# Patient Record
Sex: Female | Born: 1990 | Race: Black or African American | Hispanic: No | Marital: Single | Smoking: Never smoker
Health system: Southern US, Community
[De-identification: ages and names within clinical notes are randomized; demographics above are authoritative.]

## PROBLEM LIST (undated history)

## (undated) DIAGNOSIS — K2970 Gastritis, unspecified, without bleeding: Secondary | ICD-10-CM

## (undated) DIAGNOSIS — L729 Follicular cyst of the skin and subcutaneous tissue, unspecified: Secondary | ICD-10-CM

## (undated) DIAGNOSIS — R55 Syncope and collapse: Secondary | ICD-10-CM

## (undated) DIAGNOSIS — F458 Other somatoform disorders: Secondary | ICD-10-CM

## (undated) HISTORY — DX: Follicular cyst of the skin and subcutaneous tissue, unspecified: L72.9

---
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
## 2012-11-07 ENCOUNTER — Other Ambulatory Visit (HOSPITAL_COMMUNITY): Payer: Self-pay | Admitting: Unknown Physician Specialty

## 2012-11-07 DIAGNOSIS — J338 Other polyp of sinus: Secondary | ICD-10-CM

## 2012-11-11 ENCOUNTER — Ambulatory Visit (HOSPITAL_COMMUNITY): Payer: Medicaid Other

## 2013-02-13 ENCOUNTER — Ambulatory Visit: Payer: No Typology Code available for payment source | Admitting: Neurology

## 2017-06-23 ENCOUNTER — Ambulatory Visit (HOSPITAL_COMMUNITY)
Admission: EM | Admit: 2017-06-23 | Discharge: 2017-06-23 | Disposition: A | Discharge status: Home / Self Care | Payer: Self-pay | Attending: Family Medicine | Admitting: Family Medicine

## 2017-06-23 ENCOUNTER — Encounter (HOSPITAL_COMMUNITY): Payer: Self-pay | Admitting: Emergency Medicine

## 2017-06-23 DIAGNOSIS — J0100 Acute maxillary sinusitis, unspecified: Secondary | ICD-10-CM

## 2017-06-23 MED ORDER — AZITHROMYCIN 250 MG PO TABS: 250.0000 mg | ORAL_TABLET | Freq: Every day | ORAL | 0 refills | Status: DC

## 2017-06-23 MED ORDER — PREDNISONE 10 MG (21) PO TBPK: ORAL_TABLET | ORAL | 0 refills | Status: DC

## 2017-06-23 NOTE — ED Triage Notes (Signed)
PT reports ongoing respiratory complaints for 2 months. PT reports cold, congestion, cough, fatigue for 2 months.

## 2017-06-23 NOTE — ED Provider Notes (Signed)
[place]    CSN: [id number] Arrival date & time: 06/23/17  1214     History   Chief Complaint Chief Complaint  Patient presents with  . URI    HPI Carrie [name] is a 27 y.o. female.   Healthy 27 y.o. Female, been having on and off URI symptoms since November. Reports that this is the third episode. Reports terrible facial pain and hurts to touch. Other symptoms are coughing, congestion, sneezing, coughing, and some wheezing. No SOB. No CP. No Abdominal pain. No headache. No fever.       Past Medical History:  Diagnosis Date  . Gastritis   . Hyperventilation syndrome   . Scalp cyst 1996  . Syncope     There are no active problems to display for this patient.   Past Surgical History:  Procedure Laterality Date  . BREAST SURGERY  2012   breast mass excision    OB History    No data available       Home Medications    Prior to Admission medications   Medication Sig Start Date End Date Taking? Authorizing Provider  Ascorbic Acid (VITAMIN C) 100 MG tablet Take 100 mg by mouth daily.   Yes [provider]    Family History No family history on file.  Social History Social History   Tobacco Use  . Smoking status: Never Smoker  . Smokeless tobacco: Never Used  Substance Use Topics  . Alcohol use: No  . Drug use: No     Allergies   Penicillins   Review of Systems Review of Systems  Constitutional: Negative for chills, fatigue and fever.  HENT: Positive for congestion, rhinorrhea, sinus pressure, sinus pain, sneezing and sore throat.   Respiratory: Positive for cough and wheezing. Negative for shortness of breath.   Cardiovascular: Negative for chest pain and palpitations.  Gastrointestinal: Negative for abdominal pain.  Skin: Negative for rash.  Neurological: Negative for dizziness and headaches.     Physical Exam Triage Vital Signs ED Triage Vitals  Enc Vitals Group     BP 06/23/17 1237 132/63     Pulse Rate  06/23/17 1237 88     Resp 06/23/17 1237 16     Temp 06/23/17 1237 98.6 F (37 C)     Temp Source 06/23/17 1237 Temporal     SpO2 06/23/17 1237 100 %     Weight 06/23/17 1238 98 lb (44.5 kg)     Height 06/23/17 1238 5' (1.524 m)     Head Circumference --      Peak Flow --      Pain Score 06/23/17 1237 3     Pain Loc --      Pain Edu? --      Excl. in [place]? --    No data found.  Updated Vital Signs BP 132/63   Pulse 88   Temp 98.6 F (37 C) (Temporal)   Resp 16   Ht 5' (1.524 m)   Wt 98 lb (44.5 kg)   LMP 06/22/2017   SpO2 100%   BMI 19.14 kg/m   Physical Exam  Constitutional: She is oriented to person, place, and time. She appears well-developed and well-nourished.  HENT:  Head: Normocephalic and atraumatic.  Right Ear: External ear normal.  Left Ear: External ear normal.  Nose: Nose normal.  Mouth/Throat: Oropharynx is clear and moist. No oropharyngeal exudate.  TM pearly gray bilaterally with no erythema. Maxillary sinuses are tender to percuss.  Eyes: Conjunctivae and EOM are normal. Pupils are equal, round, and reactive to light.  Neck: Normal range of motion. Neck supple.  Cardiovascular: Normal rate, regular rhythm and normal heart sounds.  Pulmonary/Chest: Effort normal and breath sounds normal. No respiratory distress. She has no wheezes. She exhibits no tenderness.  Abdominal: Soft. Bowel sounds are normal. There is no tenderness.  Lymphadenopathy:    She has cervical adenopathy.  Neurological: She is alert and oriented to person, place, and time.  Skin: Skin is warm and dry. No rash noted.  Nursing note and vitals reviewed.    UC Treatments / Results  Labs (all labs ordered are listed, but only abnormal results are displayed) Labs Reviewed - No data to display  EKG  EKG Interpretation None       Radiology No results found.  Procedures Procedures (including critical care time)  Medications Ordered in UC Medications - No data to  display   Initial Impression / Assessment and Plan / UC Course  I have reviewed the triage vital signs and the nursing notes.  Pertinent labs & imaging results that were available during my care of the patient were reviewed by me and considered in my medical decision making (see chart for details).  Final Clinical Impressions(s) / UC Diagnoses   Final diagnoses:  Acute non-recurrent maxillary sinusitis   Clinical presentation are most consistent with sinusitis. We'll send home with Z-Pak and prednisone. Patient states understanding and will call with questions or problems. Patient instructed to call or follow up with his/her primary care doctor if failure to improve or change in symptoms. Discharge instruction given.   ED Discharge Orders    None     Controlled Substance Prescriptions [place] Controlled Substance Registry consulted? Not Applicable   Barry Dienes, NP 06/23/17 1259

## 2020-05-24 ENCOUNTER — Other Ambulatory Visit: Payer: Self-pay

## 2020-05-24 ENCOUNTER — Ambulatory Visit
Admission: EM | Admit: 2020-05-24 | Discharge: 2020-05-24 | Disposition: A | Discharge status: Home / Self Care | Payer: BC Managed Care – PPO | Attending: Emergency Medicine | Admitting: Emergency Medicine

## 2020-05-24 DIAGNOSIS — J069 Acute upper respiratory infection, unspecified: Secondary | ICD-10-CM | POA: Diagnosis not present

## 2020-05-24 DIAGNOSIS — R059 Cough, unspecified: Secondary | ICD-10-CM | POA: Diagnosis not present

## 2020-05-24 MED ORDER — ALBUTEROL SULFATE HFA 108 (90 BASE) MCG/ACT IN AERS: 2.0000 | INHALATION_SPRAY | Freq: Four times a day (QID) | RESPIRATORY_TRACT | 2 refills | Status: DC | PRN

## 2020-05-24 MED ORDER — BENZONATATE 100 MG PO CAPS: 100.0000 mg | ORAL_CAPSULE | Freq: Three times a day (TID) | ORAL | 0 refills | Status: DC

## 2020-05-24 NOTE — ED Triage Notes (Signed)
Pt states that she has a cough and shortness of breath x2 weeks. Pt is partially vaccinated.

## 2020-05-24 NOTE — ED Provider Notes (Signed)
[facility]    CSN: [id number] Arrival date & time: 05/24/20  1040      History   Chief Complaint Chief Complaint  Patient presents with  . Cough    HPI Carrie [name] is a 29 y.o. female  With history as below presenting for 2-week course of mildly productive cough with increased dyspnea.  Has had to use inhalers in the past, though does not like to use them.  Does not have 1 currently.  Denies chest pain, palpitations, fever.  States she has received first Covid vaccine, second pending.  Past Medical History:  Diagnosis Date  . Gastritis   . Hyperventilation syndrome   . Scalp cyst 1996  . Syncope     There are no problems to display for this patient.   Past Surgical History:  Procedure Laterality Date  . BREAST SURGERY  2012   breast mass excision    OB History   No obstetric history on file.      Home Medications    Prior to Admission medications   Medication Sig Start Date End Date Taking? Authorizing Provider  albuterol (VENTOLIN HFA) 108 (90 Base) MCG/ACT inhaler Inhale 2 puffs into the lungs every 6 (six) hours as needed for wheezing or shortness of breath. 05/24/20  Yes Hall-Potvin, [place], PA-C  benzonatate (TESSALON) 100 MG capsule Take 1 capsule (100 mg total) by mouth every 8 (eight) hours. 05/24/20  Yes Hall-Potvin, [place], PA-C  Ascorbic Acid (VITAMIN C) 100 MG tablet Take 100 mg by mouth daily.    [provider]  azithromycin (ZITHROMAX) 250 MG tablet Take 1 tablet (250 mg total) by mouth daily. Take first 2 tablets together, then 1 every day until finished. 06/23/17   Lucia Estelle, NP  predniSONE (STERAPRED UNI-PAK 21 TAB) 10 MG (21) TBPK tablet Take as directed 06/23/17   Lucia Estelle, NP    Family History History reviewed. No pertinent family history.  Social History Social History   Tobacco Use  . Smoking status: Never Smoker  . Smokeless tobacco: Never Used  Substance Use Topics  . Alcohol use: No  . Drug  use: No     Allergies   Penicillins   Review of Systems Review of Systems  Constitutional: Negative for fatigue and fever.  HENT: Positive for congestion. Negative for dental problem, ear pain, facial swelling, hearing loss, sinus pain, sore throat, trouble swallowing and voice change.   Eyes: Negative for photophobia, pain and visual disturbance.  Respiratory: Positive for cough and shortness of breath.        Mild DOE, resolves w/ rest  Cardiovascular: Negative for chest pain and palpitations.  Gastrointestinal: Negative for diarrhea and vomiting.  Musculoskeletal: Negative for arthralgias and myalgias.  Neurological: Negative for dizziness and headaches.     Physical Exam Triage Vital Signs ED Triage Vitals  Enc Vitals Group     BP 05/24/20 1303 122/80     Pulse Rate 05/24/20 1303 98     Resp --      Temp 05/24/20 1303 98.9 F (37.2 C)     Temp Source 05/24/20 1303 Oral     SpO2 05/24/20 1303 96 %     Weight 05/24/20 1302 101 lb (45.8 kg)     Height 05/24/20 1302 5' (1.524 m)     Head Circumference --      Peak Flow --      Pain Score 05/24/20 1302 2     Pain Loc --  Pain Edu? --      Excl. in [place]? --    No data found.  Updated Vital Signs BP 122/80 (BP Location: Left Arm)   Pulse 98   Temp 98.9 F (37.2 C) (Oral)   Ht 5' (1.524 m)   Wt 101 lb (45.8 kg)   LMP 05/20/2020   SpO2 96%   BMI 19.73 kg/m   Visual Acuity Right Eye Distance:   Left Eye Distance:   Bilateral Distance:    Right Eye Near:   Left Eye Near:    Bilateral Near:     Physical Exam Constitutional:      General: She is not in acute distress.    Appearance: She is not ill-appearing or diaphoretic.  HENT:     Head: Normocephalic and atraumatic.     Right Ear: Tympanic membrane and ear canal normal.     Left Ear: Tympanic membrane and ear canal normal.     Mouth/Throat:     Mouth: Mucous membranes are moist.     Pharynx: Oropharynx is clear. No oropharyngeal exudate or  posterior oropharyngeal erythema.  Eyes:     General: No scleral icterus.    Conjunctiva/sclera: Conjunctivae normal.     Pupils: Pupils are equal, round, and reactive to light.  Neck:     Comments: Trachea midline, negative JVD Cardiovascular:     Rate and Rhythm: Normal rate and regular rhythm.     Heart sounds: No murmur heard. No gallop.   Pulmonary:     Effort: Pulmonary effort is normal. No respiratory distress.     Breath sounds: No wheezing, rhonchi or rales.  Musculoskeletal:     Cervical back: Neck supple. No tenderness.  Lymphadenopathy:     Cervical: No cervical adenopathy.  Skin:    Capillary Refill: Capillary refill takes less than 2 seconds.     Coloration: Skin is not jaundiced or pale.     Findings: No rash.  Neurological:     General: No focal deficit present.     Mental Status: She is alert and oriented to person, place, and time.      UC Treatments / Results  Labs (all labs ordered are listed, but only abnormal results are displayed) Labs Reviewed  NOVEL CORONAVIRUS, NAA    EKG   Radiology No results found.  Procedures Procedures (including critical care time)  Medications Ordered in UC Medications - No data to display  Initial Impression / Assessment and Plan / UC Course  I have reviewed the triage vital signs and the nursing notes.  Pertinent labs & imaging results that were available during my care of the patient were reviewed by me and considered in my medical decision making (see chart for details).     Patient afebrile, nontoxic, with SpO2 96%.  Covid PCR pending.  Patient to quarantine until results are back.  We will treat supportively as outlined below.  Return precautions discussed, patient verbalized understanding and is agreeable to plan. Final Clinical Impressions(s) / UC Diagnoses   Final diagnoses:  Cough  URI with cough and congestion     Discharge Instructions     Tessalon for cough. Start flonase, atrovent nasal  spray for nasal congestion/drainage. You can use over the counter nasal saline rinse such as neti pot for nasal congestion. Keep hydrated, your urine should be clear to pale yellow in color. Tylenol/motrin for fever and pain. Monitor for any worsening of symptoms, chest pain, shortness of breath, wheezing, swelling of the  throat, go to the emergency department for further evaluation needed.     ED Prescriptions    Medication Sig Dispense Auth. Provider   albuterol (VENTOLIN HFA) 108 (90 Base) MCG/ACT inhaler Inhale 2 puffs into the lungs every 6 (six) hours as needed for wheezing or shortness of breath. 8 g Hall-Potvin, [place], PA-C   benzonatate (TESSALON) 100 MG capsule Take 1 capsule (100 mg total) by mouth every 8 (eight) hours. 21 capsule Hall-Potvin, [place], PA-C     PDMP not reviewed this encounter.   Hall-Potvin, [place], [place] 05/24/20 1411

## 2020-05-24 NOTE — Discharge Instructions (Signed)

## 2020-05-26 LAB — NOVEL CORONAVIRUS, NAA: SARS-CoV-2, NAA: NOT DETECTED

## 2020-05-26 LAB — SARS-COV-2, NAA 2 DAY TAT

## 2022-05-02 LAB — OB RESULTS CONSOLE HIV ANTIBODY (ROUTINE TESTING): HIV: NONREACTIVE

## 2022-06-13 LAB — OB RESULTS CONSOLE GBS: GBS: NEGATIVE

## 2022-07-04 ENCOUNTER — Telehealth (HOSPITAL_COMMUNITY): Payer: Self-pay | Admitting: *Deleted

## 2022-07-04 ENCOUNTER — Encounter (HOSPITAL_COMMUNITY): Payer: Self-pay | Admitting: *Deleted

## 2022-07-04 NOTE — Telephone Encounter (Signed)
Preadmission screen  

## 2022-07-09 ENCOUNTER — Inpatient Hospital Stay (HOSPITAL_COMMUNITY): Payer: BC Managed Care – PPO | Admitting: Anesthesiology

## 2022-07-09 ENCOUNTER — Encounter (HOSPITAL_COMMUNITY): Payer: BC Managed Care – PPO

## 2022-07-09 ENCOUNTER — Inpatient Hospital Stay (HOSPITAL_COMMUNITY)
Admission: RE | Admit: 2022-07-09 | Discharge: 2022-07-16 | DRG: 787 | Disposition: A | Discharge status: Home / Self Care | Payer: BC Managed Care – PPO | Attending: Obstetrics and Gynecology | Admitting: Obstetrics and Gynecology

## 2022-07-09 ENCOUNTER — Other Ambulatory Visit: Payer: Self-pay

## 2022-07-09 ENCOUNTER — Inpatient Hospital Stay (HOSPITAL_COMMUNITY): Payer: BC Managed Care – PPO

## 2022-07-09 ENCOUNTER — Encounter (HOSPITAL_COMMUNITY): Payer: Self-pay | Admitting: Obstetrics and Gynecology

## 2022-07-09 ENCOUNTER — Encounter (HOSPITAL_COMMUNITY)
Admission: RE | Disposition: A | Discharge status: Home / Self Care | Payer: Self-pay | Source: Home / Self Care | Attending: Obstetrics and Gynecology

## 2022-07-09 DIAGNOSIS — O9902 Anemia complicating childbirth: Secondary | ICD-10-CM | POA: Diagnosis present

## 2022-07-09 DIAGNOSIS — Z98891 History of uterine scar from previous surgery: Secondary | ICD-10-CM

## 2022-07-09 DIAGNOSIS — O99284 Endocrine, nutritional and metabolic diseases complicating childbirth: Secondary | ICD-10-CM | POA: Diagnosis present

## 2022-07-09 DIAGNOSIS — O480 Post-term pregnancy: Principal | ICD-10-CM | POA: Diagnosis present

## 2022-07-09 DIAGNOSIS — O1415 Severe pre-eclampsia, complicating the puerperium: Secondary | ICD-10-CM | POA: Diagnosis not present

## 2022-07-09 DIAGNOSIS — O139 Gestational [pregnancy-induced] hypertension without significant proteinuria, unspecified trimester: Secondary | ICD-10-CM | POA: Diagnosis not present

## 2022-07-09 DIAGNOSIS — E876 Hypokalemia: Secondary | ICD-10-CM | POA: Diagnosis present

## 2022-07-09 DIAGNOSIS — Z3490 Encounter for supervision of normal pregnancy, unspecified, unspecified trimester: Secondary | ICD-10-CM | POA: Diagnosis present

## 2022-07-09 DIAGNOSIS — D62 Acute posthemorrhagic anemia: Secondary | ICD-10-CM | POA: Diagnosis not present

## 2022-07-09 DIAGNOSIS — O1414 Severe pre-eclampsia complicating childbirth: Secondary | ICD-10-CM | POA: Diagnosis not present

## 2022-07-09 DIAGNOSIS — Z3A40 40 weeks gestation of pregnancy: Secondary | ICD-10-CM | POA: Diagnosis not present

## 2022-07-09 DIAGNOSIS — O26893 Other specified pregnancy related conditions, third trimester: Secondary | ICD-10-CM | POA: Diagnosis present

## 2022-07-09 DIAGNOSIS — Z3A41 41 weeks gestation of pregnancy: Secondary | ICD-10-CM

## 2022-07-09 LAB — CBC
HCT: 26.9 % — ABNORMAL LOW (ref 36.0–46.0)
HCT: 23.8 % — ABNORMAL LOW (ref 36.0–46.0)
Hemoglobin: 8.2 g/dL — ABNORMAL LOW (ref 12.0–15.0)
Hemoglobin: 7.3 g/dL — ABNORMAL LOW (ref 12.0–15.0)
MCH: 22.7 pg — ABNORMAL LOW (ref 26.0–34.0)
MCH: 22.7 pg — ABNORMAL LOW (ref 26.0–34.0)
MCHC: 30.5 g/dL (ref 30.0–36.0)
MCHC: 30.7 g/dL (ref 30.0–36.0)
MCV: 74.3 fL — ABNORMAL LOW (ref 80.0–100.0)
MCV: 74.1 fL — ABNORMAL LOW (ref 80.0–100.0)
Platelets: 263 10*3/uL (ref 150–400)
Platelets: 202 10*3/uL (ref 150–400)
RBC: 3.62 MIL/uL — ABNORMAL LOW (ref 3.87–5.11)
RBC: 3.21 MIL/uL — ABNORMAL LOW (ref 3.87–5.11)
RDW: 16.9 % — ABNORMAL HIGH (ref 11.5–15.5)
RDW: 17.1 % — ABNORMAL HIGH (ref 11.5–15.5)
WBC: 8.8 10*3/uL (ref 4.0–10.5)
WBC: 18 10*3/uL — ABNORMAL HIGH (ref 4.0–10.5)
nRBC: 1 % — ABNORMAL HIGH (ref 0.0–0.2)
nRBC: 0.2 % (ref 0.0–0.2)

## 2022-07-09 LAB — RPR: RPR Ser Ql: NONREACTIVE

## 2022-07-09 LAB — HEPATITIS B SURFACE ANTIGEN: Hepatitis B Surface Ag: NONREACTIVE

## 2022-07-09 SURGERY — Surgical Case
Anesthesia: Epidural | Site: Abdomen

## 2022-07-09 MED ORDER — SODIUM CHLORIDE 0.9 % IV SOLN: 500.0000 mg | INTRAVENOUS | Status: DC

## 2022-07-09 MED ORDER — HYDROXYZINE HCL 50 MG PO TABS: 50.0000 mg | ORAL_TABLET | Freq: Four times a day (QID) | ORAL | Status: DC | PRN

## 2022-07-09 MED ORDER — COCONUT OIL OIL: 1.0000 | TOPICAL_OIL | Status: DC | PRN

## 2022-07-09 MED ORDER — LIDOCAINE-EPINEPHRINE (PF) 2 %-1:200000 IJ SOLN
INTRAMUSCULAR | Status: DC | PRN
  Administered 2022-07-09: 3 mL via EPIDURAL
  Administered 2022-07-09: 8 mL via EPIDURAL

## 2022-07-09 MED ORDER — ONDANSETRON HCL 4 MG/2ML IJ SOLN
INTRAMUSCULAR | Status: DC | PRN
  Administered 2022-07-09: 4 mg via INTRAVENOUS

## 2022-07-09 MED ORDER — LIDOCAINE HCL (PF) 1 % IJ SOLN: 30.0000 mL | INTRAMUSCULAR | Status: DC | PRN

## 2022-07-09 MED ORDER — ACETAMINOPHEN 10 MG/ML IV SOLN
INTRAVENOUS | Status: DC | PRN
  Administered 2022-07-09: 1000 mg via INTRAVENOUS

## 2022-07-09 MED ORDER — ACETAMINOPHEN 325 MG PO TABS: 650.0000 mg | ORAL_TABLET | ORAL | Status: DC | PRN

## 2022-07-09 MED ORDER — DROPERIDOL 2.5 MG/ML IJ SOLN
INTRAMUSCULAR | Status: DC | PRN
  Administered 2022-07-09: .625 mg via INTRAVENOUS

## 2022-07-09 MED ORDER — OXYTOCIN-SODIUM CHLORIDE 30-0.9 UT/500ML-% IV SOLN
INTRAVENOUS | Status: DC | PRN
  Administered 2022-07-09: 300 mL via INTRAVENOUS

## 2022-07-09 MED ORDER — NIFEDIPINE ER OSMOTIC RELEASE 30 MG PO TB24: 30.0000 mg | ORAL_TABLET | Freq: Once | ORAL | Status: DC

## 2022-07-09 MED ORDER — TRANEXAMIC ACID-NACL 1000-0.7 MG/100ML-% IV SOLN
INTRAVENOUS | Status: DC | PRN
  Administered 2022-07-09: 1000 mg via INTRAVENOUS

## 2022-07-09 MED ORDER — LACTATED RINGERS IV SOLN: 500.0000 mL | INTRAVENOUS | Status: DC | PRN

## 2022-07-09 MED ORDER — TERBUTALINE SULFATE 1 MG/ML IJ SOLN
INTRAMUSCULAR | Status: AC
  Filled 2022-07-09: qty 1

## 2022-07-09 MED ORDER — LACTATED RINGERS IV SOLN
500.0000 mL | Freq: Once | INTRAVENOUS | Status: AC
  Administered 2022-07-09: 500 mL via INTRAVENOUS

## 2022-07-09 MED ORDER — CEFAZOLIN SODIUM-DEXTROSE 2-3 GM-%(50ML) IV SOLR
INTRAVENOUS | Status: DC | PRN
  Administered 2022-07-09: 2 g via INTRAVENOUS

## 2022-07-09 MED ORDER — ROPIVACAINE HCL 2 MG/ML IJ SOLN
INTRAMUSCULAR | Status: DC | PRN
  Administered 2022-07-09: 12 mL/h via EPIDURAL

## 2022-07-09 MED ORDER — DEXMEDETOMIDINE HCL IN NACL 80 MCG/20ML IV SOLN
INTRAVENOUS | Status: DC | PRN
  Administered 2022-07-09: 8 ug via BUCCAL
  Administered 2022-07-09: 4 ug via BUCCAL

## 2022-07-09 MED ORDER — EPHEDRINE 5 MG/ML INJ: 10.0000 mg | INTRAVENOUS | Status: DC | PRN

## 2022-07-09 MED ORDER — SODIUM BICARBONATE 8.4 % IV SOLN
INTRAVENOUS | Status: DC | PRN
  Administered 2022-07-09 (×2): 2 mL via EPIDURAL
  Administered 2022-07-09: 10 mL via EPIDURAL

## 2022-07-09 MED ORDER — MENTHOL 3 MG MT LOZG: 1.0000 | LOZENGE | OROMUCOSAL | Status: DC | PRN

## 2022-07-09 MED ORDER — OXYTOCIN-SODIUM CHLORIDE 30-0.9 UT/500ML-% IV SOLN
2.5000 [IU]/h | INTRAVENOUS | Status: DC
  Filled 2022-07-09: qty 500

## 2022-07-09 MED ORDER — SIMETHICONE 80 MG PO CHEW
80.0000 mg | CHEWABLE_TABLET | Freq: Three times a day (TID) | ORAL | Status: DC
  Administered 2022-07-10 – 2022-07-15 (×12): 80 mg via ORAL
  Filled 2022-07-09 (×15): qty 1

## 2022-07-09 MED ORDER — ACETAMINOPHEN 10 MG/ML IV SOLN
INTRAVENOUS | Status: AC
  Filled 2022-07-09: qty 100

## 2022-07-09 MED ORDER — SODIUM CHLORIDE 0.9 % IR SOLN
Status: DC | PRN
  Administered 2022-07-09: 1

## 2022-07-09 MED ORDER — OXYCODONE-ACETAMINOPHEN 5-325 MG PO TABS: 1.0000 | ORAL_TABLET | ORAL | Status: DC | PRN

## 2022-07-09 MED ORDER — OXYTOCIN-SODIUM CHLORIDE 30-0.9 UT/500ML-% IV SOLN
2.5000 [IU]/h | INTRAVENOUS | Status: AC
  Administered 2022-07-09 – 2022-07-10 (×2): 2.5 [IU]/h via INTRAVENOUS
  Filled 2022-07-09 (×2): qty 500

## 2022-07-09 MED ORDER — LACTATED RINGERS IV SOLN: INTRAVENOUS | Status: DC

## 2022-07-09 MED ORDER — SODIUM CHLORIDE 0.9 % IV SOLN
INTRAVENOUS | Status: AC
  Filled 2022-07-09: qty 5

## 2022-07-09 MED ORDER — BUPIVACAINE HCL (PF) 0.25 % IJ SOLN
INTRAMUSCULAR | Status: DC | PRN
  Administered 2022-07-09: 6 mL via EPIDURAL
  Administered 2022-07-09 (×2): 3 mL via EPIDURAL

## 2022-07-09 MED ORDER — OXYTOCIN BOLUS FROM INFUSION: 333.0000 mL | Freq: Once | INTRAVENOUS | Status: DC

## 2022-07-09 MED ORDER — MISOPROSTOL 25 MCG QUARTER TABLET
25.0000 ug | ORAL_TABLET | Freq: Once | ORAL | Status: AC
  Administered 2022-07-09: 25 ug via VAGINAL
  Filled 2022-07-09: qty 1

## 2022-07-09 MED ORDER — SENNOSIDES-DOCUSATE SODIUM 8.6-50 MG PO TABS
2.0000 | ORAL_TABLET | Freq: Every day | ORAL | Status: DC
  Administered 2022-07-10 – 2022-07-11 (×2): 2 via ORAL
  Filled 2022-07-09 (×6): qty 2

## 2022-07-09 MED ORDER — TRANEXAMIC ACID-NACL 1000-0.7 MG/100ML-% IV SOLN
INTRAVENOUS | Status: AC
  Filled 2022-07-09: qty 100

## 2022-07-09 MED ORDER — DIPHENHYDRAMINE HCL 50 MG/ML IJ SOLN: 12.5000 mg | INTRAMUSCULAR | Status: DC | PRN

## 2022-07-09 MED ORDER — STERILE WATER FOR IRRIGATION IR SOLN
Status: DC | PRN
  Administered 2022-07-09: 1000 mL

## 2022-07-09 MED ORDER — MISOPROSTOL 25 MCG QUARTER TABLET
25.0000 ug | ORAL_TABLET | Freq: Once | ORAL | Status: AC
  Administered 2022-07-09: 25 ug via ORAL
  Filled 2022-07-09: qty 1

## 2022-07-09 MED ORDER — DEXAMETHASONE SODIUM PHOSPHATE 10 MG/ML IJ SOLN
INTRAMUSCULAR | Status: DC | PRN
  Administered 2022-07-09: 10 mg via INTRAVENOUS

## 2022-07-09 MED ORDER — ONDANSETRON HCL 4 MG/2ML IJ SOLN
INTRAMUSCULAR | Status: AC
  Filled 2022-07-09: qty 2

## 2022-07-09 MED ORDER — OXYTOCIN-SODIUM CHLORIDE 30-0.9 UT/500ML-% IV SOLN
INTRAVENOUS | Status: AC
  Filled 2022-07-09: qty 500

## 2022-07-09 MED ORDER — SODIUM BICARBONATE 8.4 % IV SOLN
INTRAVENOUS | Status: AC
  Filled 2022-07-09: qty 50

## 2022-07-09 MED ORDER — FENTANYL-BUPIVACAINE-NACL 0.5-0.125-0.9 MG/250ML-% EP SOLN
12.0000 mL/h | EPIDURAL | Status: DC | PRN
  Filled 2022-07-09: qty 250

## 2022-07-09 MED ORDER — LACTATED RINGERS AMNIOINFUSION: INTRAVENOUS | Status: DC

## 2022-07-09 MED ORDER — DIPHENHYDRAMINE HCL 25 MG PO CAPS
25.0000 mg | ORAL_CAPSULE | Freq: Four times a day (QID) | ORAL | Status: DC | PRN
  Administered 2022-07-12: 25 mg via ORAL
  Filled 2022-07-09: qty 1

## 2022-07-09 MED ORDER — DIBUCAINE (PERIANAL) 1 % EX OINT: 1.0000 | TOPICAL_OINTMENT | CUTANEOUS | Status: DC | PRN

## 2022-07-09 MED ORDER — FENTANYL CITRATE (PF) 100 MCG/2ML IJ SOLN
INTRAMUSCULAR | Status: DC | PRN
  Administered 2022-07-09: 75 ug via EPIDURAL
  Administered 2022-07-09: 25 ug via EPIDURAL

## 2022-07-09 MED ORDER — SODIUM CHLORIDE 0.9 % IV SOLN
INTRAVENOUS | Status: DC | PRN
  Administered 2022-07-09: 500 mg via INTRAVENOUS

## 2022-07-09 MED ORDER — ZOLPIDEM TARTRATE 5 MG PO TABS: 5.0000 mg | ORAL_TABLET | Freq: Every evening | ORAL | Status: DC | PRN

## 2022-07-09 MED ORDER — DROPERIDOL 2.5 MG/ML IJ SOLN
INTRAMUSCULAR | Status: AC
  Filled 2022-07-09: qty 2

## 2022-07-09 MED ORDER — PRENATAL MULTIVITAMIN CH
1.0000 | ORAL_TABLET | Freq: Every day | ORAL | Status: DC
  Filled 2022-07-09: qty 1

## 2022-07-09 MED ORDER — CEFAZOLIN SODIUM-DEXTROSE 2-4 GM/100ML-% IV SOLN: 2.0000 g | INTRAVENOUS | Status: DC

## 2022-07-09 MED ORDER — FENTANYL CITRATE (PF) 100 MCG/2ML IJ SOLN
INTRAMUSCULAR | Status: AC
  Filled 2022-07-09: qty 2

## 2022-07-09 MED ORDER — TERBUTALINE SULFATE 1 MG/ML IJ SOLN: 0.2500 mg | Freq: Once | INTRAMUSCULAR | Status: DC | PRN

## 2022-07-09 MED ORDER — PHENYLEPHRINE 80 MCG/ML (10ML) SYRINGE FOR IV PUSH (FOR BLOOD PRESSURE SUPPORT)
PREFILLED_SYRINGE | INTRAVENOUS | Status: DC | PRN
  Administered 2022-07-09: 160 ug via INTRAVENOUS

## 2022-07-09 MED ORDER — OXYTOCIN-SODIUM CHLORIDE 30-0.9 UT/500ML-% IV SOLN
1.0000 m[IU]/min | INTRAVENOUS | Status: DC
  Administered 2022-07-09: 1 m[IU]/min via INTRAVENOUS

## 2022-07-09 MED ORDER — WITCH HAZEL-GLYCERIN EX PADS: 1.0000 | MEDICATED_PAD | CUTANEOUS | Status: DC | PRN

## 2022-07-09 MED ORDER — LIDOCAINE-EPINEPHRINE (PF) 2 %-1:200000 IJ SOLN
INTRAMUSCULAR | Status: AC
  Filled 2022-07-09: qty 20

## 2022-07-09 MED ORDER — BUPIVACAINE HCL (PF) 0.25 % IJ SOLN
INTRAMUSCULAR | Status: DC | PRN
  Administered 2022-07-09: 6 mL via EPIDURAL

## 2022-07-09 MED ORDER — FENTANYL CITRATE (PF) 100 MCG/2ML IJ SOLN
50.0000 ug | INTRAMUSCULAR | Status: DC | PRN
  Administered 2022-07-09: 50 ug via INTRAVENOUS
  Administered 2022-07-09: 100 ug via INTRAVENOUS
  Filled 2022-07-09: qty 2

## 2022-07-09 MED ORDER — LACTATED RINGERS IV SOLN: INTRAVENOUS | Status: DC | PRN

## 2022-07-09 MED ORDER — TERBUTALINE SULFATE 1 MG/ML IJ SOLN
0.2500 mg | Freq: Once | INTRAMUSCULAR | Status: AC | PRN
  Administered 2022-07-09: 0.25 mg via SUBCUTANEOUS
  Filled 2022-07-09: qty 1

## 2022-07-09 MED ORDER — MORPHINE SULFATE (PF) 0.5 MG/ML IJ SOLN
INTRAMUSCULAR | Status: DC | PRN
  Administered 2022-07-09 (×2): 1.5 mg via EPIDURAL

## 2022-07-09 MED ORDER — CEFAZOLIN SODIUM-DEXTROSE 2-4 GM/100ML-% IV SOLN
INTRAVENOUS | Status: AC
  Filled 2022-07-09: qty 100

## 2022-07-09 MED ORDER — SOD CITRATE-CITRIC ACID 500-334 MG/5ML PO SOLN
30.0000 mL | ORAL | Status: DC | PRN
  Administered 2022-07-09: 30 mL via ORAL
  Filled 2022-07-09: qty 30

## 2022-07-09 MED ORDER — TERBUTALINE SULFATE 1 MG/ML IJ SOLN: 0.2500 mg | Freq: Once | INTRAMUSCULAR | Status: DC

## 2022-07-09 MED ORDER — SIMETHICONE 80 MG PO CHEW: 80.0000 mg | CHEWABLE_TABLET | ORAL | Status: DC | PRN

## 2022-07-09 MED ORDER — PHENYLEPHRINE 80 MCG/ML (10ML) SYRINGE FOR IV PUSH (FOR BLOOD PRESSURE SUPPORT)
80.0000 ug | PREFILLED_SYRINGE | INTRAVENOUS | Status: DC | PRN
  Filled 2022-07-09: qty 10

## 2022-07-09 MED ORDER — OXYCODONE-ACETAMINOPHEN 5-325 MG PO TABS: 2.0000 | ORAL_TABLET | ORAL | Status: DC | PRN

## 2022-07-09 MED ORDER — MORPHINE SULFATE (PF) 0.5 MG/ML IJ SOLN
INTRAMUSCULAR | Status: AC
  Filled 2022-07-09: qty 10

## 2022-07-09 MED ORDER — SOD CITRATE-CITRIC ACID 500-334 MG/5ML PO SOLN: 30.0000 mL | ORAL | Status: DC

## 2022-07-09 MED ORDER — METHYLERGONOVINE MALEATE 0.2 MG/ML IJ SOLN
INTRAMUSCULAR | Status: DC | PRN
  Administered 2022-07-09: .2 mg via INTRAMUSCULAR

## 2022-07-09 MED ORDER — PHENYLEPHRINE 80 MCG/ML (10ML) SYRINGE FOR IV PUSH (FOR BLOOD PRESSURE SUPPORT)
80.0000 ug | PREFILLED_SYRINGE | INTRAVENOUS | Status: DC | PRN
  Administered 2022-07-09: 80 ug via INTRAVENOUS

## 2022-07-09 MED ORDER — ONDANSETRON HCL 4 MG/2ML IJ SOLN
4.0000 mg | Freq: Four times a day (QID) | INTRAMUSCULAR | Status: DC | PRN
  Administered 2022-07-09 (×2): 4 mg via INTRAVENOUS
  Filled 2022-07-09 (×2): qty 2

## 2022-07-09 SURGICAL SUPPLY — 38 items
BENZOIN TINCTURE PRP APPL 2/3 (GAUZE/BANDAGES/DRESSINGS) ×1 IMPLANT
CHLORAPREP W/TINT 26 (MISCELLANEOUS) ×2 IMPLANT
CLAMP UMBILICAL CORD (MISCELLANEOUS) ×1 IMPLANT
CLOTH BEACON ORANGE TIMEOUT ST (SAFETY) ×1 IMPLANT
CLSR STERI-STRIP ANTIMIC 1/2X4 (GAUZE/BANDAGES/DRESSINGS) IMPLANT
DRAIN JACKSON PRT FLT 10 (DRAIN) IMPLANT
DRSG OPSITE POSTOP 4X10 (GAUZE/BANDAGES/DRESSINGS) ×1 IMPLANT
ELECT REM PT RETURN 9FT ADLT (ELECTROSURGICAL) ×1
ELECTRODE REM PT RTRN 9FT ADLT (ELECTROSURGICAL) ×1 IMPLANT
EVACUATOR SILICONE 100CC (DRAIN) IMPLANT
EXTRACTOR VACUUM M CUP 4 TUBE (SUCTIONS) IMPLANT
GAUZE PAD ABD 7.5X8 STRL (GAUZE/BANDAGES/DRESSINGS) IMPLANT
GAUZE SPONGE 4X4 12PLY STRL LF (GAUZE/BANDAGES/DRESSINGS) IMPLANT
GLOVE BIO SURGEON STRL SZ 6.5 (GLOVE) ×1 IMPLANT
GLOVE BIOGEL PI IND STRL 7.0 (GLOVE) ×2 IMPLANT
GOWN STRL REUS W/TWL LRG LVL3 (GOWN DISPOSABLE) ×2 IMPLANT
HEMOSTAT SURGICEL 2X3 (HEMOSTASIS) IMPLANT
KIT ABG SYR 3ML LUER SLIP (SYRINGE) IMPLANT
NDL HYPO 25X5/8 SAFETYGLIDE (NEEDLE) IMPLANT
NEEDLE HYPO 25X5/8 SAFETYGLIDE (NEEDLE) IMPLANT
NS IRRIG 1000ML POUR BTL (IV SOLUTION) ×1 IMPLANT
PACK C SECTION WH (CUSTOM PROCEDURE TRAY) ×1 IMPLANT
PAD OB MATERNITY 4.3X12.25 (PERSONAL CARE ITEMS) ×1 IMPLANT
RTRCTR C-SECT PINK 25CM LRG (MISCELLANEOUS) IMPLANT
STRIP CLOSURE SKIN 1/2X4 (GAUZE/BANDAGES/DRESSINGS) ×1 IMPLANT
SUT CHROMIC 0 CT 1 (SUTURE) ×1 IMPLANT
SUT MNCRL AB 3-0 PS2 27 (SUTURE) ×1 IMPLANT
SUT PLAIN 2 0 (SUTURE) ×2
SUT PLAIN 2 0 XLH (SUTURE) ×1 IMPLANT
SUT PLAIN ABS 2-0 CT1 27XMFL (SUTURE) ×2 IMPLANT
SUT SILK 2 0 SH (SUTURE) IMPLANT
SUT VIC AB 0 CTX 36 (SUTURE) ×4
SUT VIC AB 0 CTX36XBRD ANBCTRL (SUTURE) ×4 IMPLANT
SUT VIC AB 2-0 SH 27 (SUTURE) ×1
SUT VIC AB 2-0 SH 27XBRD (SUTURE) IMPLANT
TOWEL OR 17X24 6PK STRL BLUE (TOWEL DISPOSABLE) ×1 IMPLANT
TRAY FOLEY W/BAG SLVR 14FR LF (SET/KITS/TRAYS/PACK) ×1 IMPLANT
WATER STERILE IRR 1000ML POUR (IV SOLUTION) ×1 IMPLANT

## 2022-07-09 NOTE — Progress Notes (Signed)
Pt comfortable BP (!) 127/91   Pulse (!) 102   Temp 98.3 F (36.8 C) (Oral)   Resp 16   Ht 5' (1.524 m)   Wt 60.9 kg   BMI 26.21 kg/m  Cat 2 tracing Cx unchanged Plan CS  R&B discussed with the pt and family

## 2022-07-09 NOTE — Anesthesia Procedure Notes (Signed)
Epidural Patient location during procedure: OB Start time: 07/09/2022 7:27 AM End time: 07/09/2022 7:39 AM  Staffing Anesthesiologist: Oleta Mouse, MD Performed: anesthesiologist   Preanesthetic Checklist Completed: patient identified, IV checked, risks and benefits discussed, monitors and equipment checked, pre-op evaluation and timeout performed  Epidural Patient position: sitting Prep: DuraPrep Patient monitoring: heart rate, continuous pulse ox and blood pressure Approach: midline Location: L5-S1 Injection technique: LOR saline  Needle:  Needle type: Tuohy  Needle gauge: 17 G Needle length: 9 cm Needle insertion depth: 6 cm Catheter type: closed end flexible Catheter size: 19 Gauge Catheter at skin depth: 12 cm Test dose: negative and 2% lidocaine with Epi 1:200 K  Assessment Events: blood not aspirated, no cerebrospinal fluid, injection not painful, no injection resistance, no paresthesia and negative IV test

## 2022-07-09 NOTE — Op Note (Signed)
Op note Cesarean Section Procedure Note   Carrie [name]  07/09/2022  Indications:  fetal intolerance to labor   Failure to dilate  Pre-operative Diagnosis: Unscheduled Cesarean Section, non-reassuring fetal heart rate.   Post-operative Diagnosis: Same   Surgeon: Surgeon(s) and Role:    * Arlana Canizales, Gwynneth Munson, MD - Primary    * Ndulue, Lyndel Safe, MD - Assisting   Assistants: see above.  An assistant was needed for the complexity of the case   Anesthesia: epidural   Procedure Details:  The patient was seen in the Holding Room. The risks, benefits, complications, treatment options, and expected outcomes were discussed with the patient. The patient concurred with the proposed plan, giving informed consent. identified as Carrie [name] and the procedure verified as C-Section Delivery. A Time Out was held and the above information confirmed.  After induction of anesthesia, the patient was draped and prepped in the usual sterile manner. A transverse incision was made and carried down through the subcutaneous tissue to the fascia. Fascial incision was made in the midline and extended transversely. The fascia was separated from the underlying rectus muscle superiorly and inferiorly. The peritoneum was identified and entered. Peritoneal incision was extended longitudinally with good visualization of bowel and bladder. The utero-vesical peritoneal reflection was incised transversely and the bladder flap was bluntly freed from the lower uterine segment.  An alexsis retractor was placed in the abdomen.   A low transverse uterine incision was made. Delivered from cephalic presentation was a  infant, with Apgar scores of 8 at one minute and 9 at five minutes. Cord ph was sent the umbilical cord was clamped and cut cord blood was obtained for evaluation. The placenta was removed Intact and appeared normal. The uterine outline, tubes and ovaries appeared normal}. The uterine incision was closed with running locked  sutures of 0Vicryl. A second layer 0 vicrlyl was used to imbricate the uterine incision .  Two figure 8 suture used to stop bleeding at the left angle.  The urine was blood tinged and sterile milk 300 cc was used to back fill the bladder.  No leakage of milk seen.  Surgicel placed at the left angle of the uterus   Hemostasis was observed. Lavage was carried out until clear. The alexsis was removed.  The peritoneum was closed with 0 chromic.  The muscles were examined and any bleeders were made hemostatic using bovie cautery device.   The fascia was then reapproximated with running sutures of 0 vicryl.  The subcutaneous tissue was reapproximated  With interrupted stitches using 2-0 plain gut. The subcuticular closure was performed using 3-68moocryl     Instrument, sponge, and needle counts were correct prior the abdominal closure and were correct at the conclusion of the case.    Findings: infant was delivered from vtx presentation. The fluid was meconium stained.  The uterus tubes and ovaries appeared normal.     Estimated Blood Loss: 639cc   Total IV Fluids: 14015m  Urine Output:  100CC OF rose colored urine  Specimens: placenta to pathology  Complications: no complications  Disposition: PACU - hemodynamically stable.   Maternal Condition: stable   Baby condition / location:  Couplet care / Skin to Skin  Attending Attestation: I was present and scrubbed for the entire procedure.   Signed: Surgeon(s): Ndulue, ChLyndel SafeMD DiCrawford GivensMD

## 2022-07-09 NOTE — H&P (Signed)
Carrie [name] is a 32 y.o. female presenting for  induction of labor. Pt was given po and vaginal cytotec and progressed to 8 cm.  She is comfortable with epidural History OB History     Gravida  3   Para  1   Term  1   Preterm      AB  1   Living  1      SAB      IAB  1   Ectopic      Multiple      Live Births  1          Past Medical History:  Diagnosis Date   Gastritis    Hyperventilation syndrome    Scalp cyst 05/28/1994   Syncope    Past Surgical History:  Procedure Laterality Date   BREAST SURGERY  2012   breast mass excision   Family History: family history includes Hypertension in her father; Ovarian cancer in her mother. Social History:  reports that she has never smoked. She has never used smokeless tobacco. She reports that she does not drink alcohol and does not use drugs.  Review of Systems - Negative except above Physical Examination: General appearance - alert, well appearing, and in no distress Chest - clear to auscultation, no wheezes, rales or rhonchi, symmetric air entry Heart - normal rate and regular rhythm Abdomen - soft, nontender, nondistended, no masses or organomegaly gravid Extremities - peripheral pulses normal, no pedal edema, no clubbing or cyanosis, Homan's sign negative b   Dilation:  (very thick anterior lip) Effacement (%): 50 (very swollen) Station: 0 Exam by:: [place], rn Blood pressure (!) 126/94, pulse (!) 111, temperature 98.2 F (36.8 C), temperature source Oral, resp. rate 18, height 5' (1.524 m), weight 60.9 kg. Exam Physical Exam  Prenatal labs: ABO, Rh: --/--/B POS (02/12 0040) Antibody: NEG (02/12 0040) Rubella:   RPR: NON REACTIVE (02/12 0028)  HBsAg:    HIV:    GBS: Negative/-- (01/17 0000)   Assessment/Plan: Active labor Cat 2 with decels Terbutaline given, amnioinfusion done It no improvement plan CS Pt and family agree with the plan  Carrie [name] A Carrie [name] 07/09/2022, 1:38 PM

## 2022-07-09 NOTE — Anesthesia Preprocedure Evaluation (Signed)
Anesthesia Evaluation  Patient identified by MRN, date of birth, ID band Patient awake    Reviewed: Allergy & Precautions, Patient's Chart, lab work & pertinent test results  History of Anesthesia Complications Negative for: history of anesthetic complications  Airway Mallampati: I       Dental  (+) Dental Advisory Given   Pulmonary neg pulmonary ROS   breath sounds clear to auscultation       Cardiovascular negative cardio ROS  Rhythm:Regular     Neuro/Psych negative neurological ROS  negative psych ROS   GI/Hepatic negative GI ROS, Neg liver ROS,,,  Endo/Other  negative endocrine ROS    Renal/GU negative Renal ROS     Musculoskeletal   Abdominal   Peds  Hematology  (+) Blood dyscrasia, anemia Lab Results      Component                Value               Date                      WBC                      8.8                 07/09/2022                HGB                      8.2 (L)             07/09/2022                HCT                      26.9 (L)            07/09/2022                MCV                      74.3 (L)            07/09/2022                PLT                      263                 07/09/2022            Denies blood thinners   Anesthesia Other Findings Pcn causes hives  Reproductive/Obstetrics (+) Pregnancy                             Anesthesia Physical Anesthesia Plan  ASA: 2  Anesthesia Plan: Epidural   Post-op Pain Management:    Induction:   PONV Risk Score and Plan: 2 and Treatment may vary due to age or medical condition  Airway Management Planned: Natural Airway  Additional Equipment:   Intra-op Plan:   Post-operative Plan:   Informed Consent: I have reviewed the patients History and Physical, chart, labs and discussed the procedure including the risks, benefits and alternatives for the proposed anesthesia with the patient or authorized  representative who has indicated his/her understanding and acceptance.       Plan  Discussed with:   Anesthesia Plan Comments:        Anesthesia Quick Evaluation

## 2022-07-09 NOTE — Anesthesia Preprocedure Evaluation (Addendum)
Anesthesia Evaluation  Patient identified by MRN, date of birth, ID band Patient awake    Reviewed: Allergy & Precautions, H&P , NPO status , Patient's Chart, lab work & pertinent test results, reviewed documented beta blocker date and time   Airway Mallampati: II  TM Distance: >3 FB Neck ROM: full    Dental no notable dental hx.    Pulmonary neg pulmonary ROS   Pulmonary exam normal breath sounds clear to auscultation       Cardiovascular negative cardio ROS Normal cardiovascular exam Rhythm:regular Rate:Normal     Neuro/Psych negative neurological ROS  negative psych ROS   GI/Hepatic negative GI ROS, Neg liver ROS,,,  Endo/Other  negative endocrine ROS    Renal/GU negative Renal ROS  negative genitourinary   Musculoskeletal   Abdominal   Peds  Hematology negative hematology ROS (+)   Anesthesia Other Findings   Reproductive/Obstetrics (+) Pregnancy                             Anesthesia Physical Anesthesia Plan  ASA: 2 and emergent  Anesthesia Plan: Epidural   Post-op Pain Management:    Induction:   PONV Risk Score and Plan:   Airway Management Planned:   Additional Equipment:   Intra-op Plan:   Post-operative Plan:   Informed Consent: I have reviewed the patients History and Physical, chart, labs and discussed the procedure including the risks, benefits and alternatives for the proposed anesthesia with the patient or authorized representative who has indicated his/her understanding and acceptance.       Plan Discussed with:   Anesthesia Plan Comments:        Anesthesia Quick Evaluation

## 2022-07-09 NOTE — Progress Notes (Signed)
The Rn called Dr Charlesetta Garibaldi regarding a Procardia Order and about the patient's hemoglobin of 7.3.   No further orders were given. Per MD there are BP parameters in the orders for the procardia, when to give the medication and when not to give.the medication .   The MD stated that the patient had a couple of high blood pressures in labor and delivery that is why the BP medicine was ordered.   The Rn informed the MD about the patient's Hemglobin drop from 8.2 to 7.3. Per the MD a CBC will be drawn in the am. No further orders were given.

## 2022-07-09 NOTE — Transfer of Care (Signed)
Immediate Anesthesia Transfer of Care Note  Patient: Carrie [name]  Procedure(s) Performed: CESAREAN SECTION (Abdomen)  Patient Location: PACU  Anesthesia Type:Epidural  Level of Consciousness: awake  Airway & Oxygen Therapy: Patient Spontanous Breathing  Post-op Assessment: Report given to RN  Post vital signs: Reviewed and stable  Last Vitals:  Vitals Value Taken Time  BP    Temp    Pulse    Resp    SpO2      Last Pain:  Vitals:   07/09/22 1608  TempSrc: Oral  PainSc:       Patients Stated Pain Goal: 0 ([id number])  Complications: No notable events documented.

## 2022-07-09 NOTE — Anesthesia Postprocedure Evaluation (Signed)
Anesthesia Post Note  Patient: Equities trader  Procedure(s) Performed: CESAREAN SECTION (Abdomen)     Patient location during evaluation: Mother Baby Anesthesia Type: Epidural Level of consciousness: awake and alert Pain management: pain level controlled Vital Signs Assessment: post-procedure vital signs reviewed and stable Respiratory status: spontaneous breathing, nonlabored ventilation and respiratory function stable Cardiovascular status: stable Postop Assessment: no headache, no backache and epidural receding Anesthetic complications: no  No notable events documented.  Last Vitals:  Vitals:   07/09/22 1925 07/09/22 2025  BP: 102/79 119/83  Pulse: (!) 117 90  Resp: 16 18  Temp: 36.9 C 36.7 C  SpO2: 97% 98%    Last Pain:  Vitals:   07/09/22 2025  TempSrc: Oral  PainSc:                  Montgomery Favor

## 2022-07-10 ENCOUNTER — Encounter (HOSPITAL_COMMUNITY): Payer: Self-pay | Admitting: Obstetrics and Gynecology

## 2022-07-10 DIAGNOSIS — Z3490 Encounter for supervision of normal pregnancy, unspecified, unspecified trimester: Secondary | ICD-10-CM | POA: Diagnosis present

## 2022-07-10 DIAGNOSIS — D62 Acute posthemorrhagic anemia: Secondary | ICD-10-CM | POA: Diagnosis not present

## 2022-07-10 DIAGNOSIS — O139 Gestational [pregnancy-induced] hypertension without significant proteinuria, unspecified trimester: Secondary | ICD-10-CM | POA: Diagnosis not present

## 2022-07-10 DIAGNOSIS — Z98891 History of uterine scar from previous surgery: Secondary | ICD-10-CM

## 2022-07-10 LAB — CBC
HCT: 18.7 % — ABNORMAL LOW (ref 36.0–46.0)
Hemoglobin: 5.7 g/dL — CL (ref 12.0–15.0)
MCH: 22.4 pg — ABNORMAL LOW (ref 26.0–34.0)
MCHC: 30.5 g/dL (ref 30.0–36.0)
MCV: 73.6 fL — ABNORMAL LOW (ref 80.0–100.0)
Platelets: 193 10*3/uL (ref 150–400)
RBC: 2.54 MIL/uL — ABNORMAL LOW (ref 3.87–5.11)
RDW: 16.9 % — ABNORMAL HIGH (ref 11.5–15.5)
WBC: 20.3 10*3/uL — ABNORMAL HIGH (ref 4.0–10.5)
nRBC: 0.1 % (ref 0.0–0.2)

## 2022-07-10 MED ORDER — ACETAMINOPHEN 500 MG PO TABS
1000.0000 mg | ORAL_TABLET | Freq: Four times a day (QID) | ORAL | Status: DC
  Administered 2022-07-10 – 2022-07-16 (×23): 1000 mg via ORAL
  Filled 2022-07-10 (×24): qty 2

## 2022-07-10 MED ORDER — IBUPROFEN 600 MG PO TABS
600.0000 mg | ORAL_TABLET | Freq: Four times a day (QID) | ORAL | Status: DC
  Administered 2022-07-10 – 2022-07-16 (×25): 600 mg via ORAL
  Filled 2022-07-10 (×26): qty 1

## 2022-07-10 MED ORDER — ACETAMINOPHEN 500 MG PO TABS: 1000.0000 mg | ORAL_TABLET | Freq: Four times a day (QID) | ORAL | Status: DC | PRN

## 2022-07-10 MED ORDER — IBUPROFEN 600 MG PO TABS: 600.0000 mg | ORAL_TABLET | Freq: Four times a day (QID) | ORAL | Status: DC | PRN

## 2022-07-10 MED ORDER — IRON SUCROSE 500 MG IVPB - SIMPLE MED
500.0000 mg | Freq: Once | INTRAVENOUS | Status: AC
  Administered 2022-07-10: 500 mg via INTRAVENOUS
  Filled 2022-07-10 (×2): qty 275

## 2022-07-10 MED ORDER — OXYCODONE HCL 5 MG PO TABS: 10.0000 mg | ORAL_TABLET | Freq: Four times a day (QID) | ORAL | Status: DC | PRN

## 2022-07-10 MED ORDER — OXYCODONE HCL 5 MG PO TABS: 5.0000 mg | ORAL_TABLET | Freq: Four times a day (QID) | ORAL | Status: DC | PRN

## 2022-07-10 MED ORDER — COMPLETENATE 29-1 MG PO CHEW
1.0000 | CHEWABLE_TABLET | Freq: Every day | ORAL | Status: DC
  Administered 2022-07-11 – 2022-07-16 (×4): 1 via ORAL
  Filled 2022-07-10 (×5): qty 1

## 2022-07-10 NOTE — Progress Notes (Addendum)
The Rn called [facility] FNP regarding postpartum pain medicine for the patient.Verbal orders were given for 600 mg of Motrin every 6  hours scheduled, 1000 mg tylenol scheduled every 6 hours. Oxycodone extented release 5 to 10 mg PRN every 6 hours. Also to dscontinue the perocet order.

## 2022-07-10 NOTE — Progress Notes (Addendum)
The Rn called [facility] FNP regarding patient's hemglobon of 5.7, Previously, the hemoglobin was 7.3. The provider will round on patient and discuss options.

## 2022-07-10 NOTE — Progress Notes (Signed)
Attempt R hand IV, 20g cath-good blood return. When NS slow flush0.46m, noted swelling, d/c cath noted intact. Pt tolerated procedure well.

## 2022-07-10 NOTE — Progress Notes (Addendum)
The RN reviewed the patient's chart for significant labs. The HIV results are under OB Results Consult ( under Chart review, media , Labs.), which are NR. . Rubella is in process see chart review labs. HBSAG results nonreactive located under chart review labs. In same are aof chart , the RPR is NR.

## 2022-07-10 NOTE — Lactation Note (Signed)
This note was copied from a baby's chart. Lactation Consultation Note  Patient Name: Carrie [name] [id number] Date: 07/10/2022 Reason for consult:  (mom in the bathroom , LC will F/U) Age:30 hours     Feeding Mother's Current Feeding Choice: Breast Milk and Formula   Consult Status Consult Status: Follow-up Date: 07/10/22 Follow-up type: In-patient    [place] 07/10/2022, 9:00 AM

## 2022-07-10 NOTE — Progress Notes (Signed)
Rn pulled foley at 0510, The patient's urine output from foley was 240.480 and 800.mls.

## 2022-07-10 NOTE — Social Work (Signed)
CSW received consult for Mental check in  Went to abortion clinic early pregnancy / thought about termination. CSW met with MOB to offer support and complete assessment. CSW entered the room observed MOB resting in bed and FOB at bedside. CSW introduced self, CSW role and reason for visit, MOB was agreeable to visit and allowed FOB to remain in the room. CSW  inquired about how MOB was feeling, MOB reported good. CSW inquired about MOB MH hx, MOB reported no Concerns. CSW inquired about MOB mood throughout her pregnancy, MOB reported her mood was "okay". CSW assessed for safety, MOB denied  any SI or HI. CSW inquired about PPD, MOB reported she feels like she has undiagnosed PPD for about 6 weeks while her son was in the NICU. MOB reported not wanting to go anywhere or do anything during that time. CSW provided education regarding the baby blues period vs. perinatal mood disorders, discussed treatment and gave resources for mental health follow up if concerns arise.  CSW recommends self-evaluation during the postpartum time period using the New Mom Checklist from Postpartum Progress and encouraged MOB to contact a medical professional if symptoms are noted at any time. MOB identified her mom as her main support. CSW provided review of Sudden Infant Death Syndrome (SIDS) precautions.  MOB reported they have all necessary items  for the infant including a bassinet and a crib for him to sleep. MOB  identified [facility] for infants follow up care.  CSW identifies no further need for intervention and no barriers to discharge at this time.  Letta Kocher, [place] Social Worker [phone]

## 2022-07-10 NOTE — Plan of Care (Signed)
  Problem: Clinical Measurements: Goal: Ability to maintain clinical measurements within normal limits will improve Outcome: Completed/Met Goal: Respiratory complications will improve Outcome: Completed/Met Goal: Cardiovascular complication will be avoided Outcome: Completed/Met   Problem: Activity: Goal: Risk for activity intolerance will decrease Outcome: Completed/Met

## 2022-07-10 NOTE — Progress Notes (Signed)
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 EBL was 6391m, hgb drop of 8.2-7.3-5.7, R/B/A about needing blood transfusion given pt declined, but ok to get IV Iron, pt developed GHTN, BP 122/80 was on procardia 2/12, but since PP pt been normotensive and d/c procardia, asymptomatic, unable to get PCR r/t blood tinge urine.   Patient Active Problem List   Diagnosis Date Noted   Encounter for induction of labor 07/10/2022   S/P cesarean section 07/10/2022   Acute blood loss anemia 07/10/2022   Normal postpartum course 07/10/2022   Gestational hypertension 07/10/2022   Post term pregnancy 07/09/2022     Active Ambulatory Problems    Diagnosis Date Noted   No Active Ambulatory Problems   Resolved Ambulatory Problems    Diagnosis Date Noted   No Resolved Ambulatory Problems   Past Medical History:  Diagnosis Date   Gastritis    Hyperventilation syndrome    Scalp cyst 05/28/1994   Syncope      Subjective: Patient up ad lib, denies syncope or dizziness. Reports consuming regular diet without issues and denies N/V. Patient reports 0 bowel movement + passing flatus.  Denies issues with urination and reports bleeding is "lighter."  Patient is breastfeeding and reports going well.  Desires mirenia at 6 weeks PP for postpartum contraception.  Pain is being appropriately managed with use of po meds. Pt denies HA, RUQ pain or vision changes.  Denies sob, dizziness or trouble ambulating.    Objective: Patient Vitals for the past 24 hrs:  BP Temp Temp src Pulse Resp SpO2  07/10/22 1105 116/82 97.9 F (36.6 C) Oral 88 16 97 %  07/10/22 0850 122/80 -- -- 89 16 96 %  07/10/22 0509 119/75 (!) 97.4 F (36.3 C) Oral  72 18 98 %  07/10/22 0300 -- 98 F (36.7 C) Oral -- 18 98 %  07/10/22 0108 -- -- -- -- -- 100 %  07/09/22 2227 115/81 97.7 F (36.5 C) Oral 82 18 98 %  07/09/22 2128 112/70 98.1 F (36.7 C) Oral 90 18 97 %  07/09/22 2025 119/83 98.1 F (36.7 C) Oral 90 18 98 %  07/09/22 1925 102/79 98.4 F (36.9 C) Oral (!) 117 16 97 %  07/09/22 1900 116/77 99.4 F (37.4 C) Oral (!) 102 16 97 %  07/09/22 1845 114/80 -- -- (!) 101 18 98 %  07/09/22 1830 117/87 -- -- (!) 102 15 98 %  07/09/22 1819 -- -- -- -- 14 --  07/09/22 1818 -- 98.5 F (36.9 C) Oral 98 -- 100 %  07/09/22 1817 118/81 -- -- -- -- --  07/09/22 1643 (!) 127/91 -- -- (!) 102 -- --  07/09/22 1639 115/82 -- -- (!) 109 -- --  07/09/22 1614 126/83 -- -- 98 -- --  07/09/22 1608 -- 98.3 F (36.8 C) Oral -- -- --  07/09/22 1548 129/81 -- -- (!) 107 16 --  07/09/22 1515 132/79 -- -- (!) 102 16 --  07/09/22 1501 135/84 -- -- (!) 137 -- --  07/09/22 1432 138/83 -- -- (!) 103 -- --  07/09/22 1426 136/73 -- -- (!) 105 16 --  07/09/22 1421 (!) 146/89 -- --  100 18 --  07/09/22 1401 (!) 134/93 98.1 F (36.7 C) Oral (!) 113 18 --  07/09/22 1331 132/76 -- -- (!) 106 -- --  07/09/22 1307 -- -- -- -- 18 --  07/09/22 1302 (!) 126/94 -- -- (!) 111 -- --  07/09/22 1242 135/74 -- -- 81 -- --  07/09/22 1237 110/70 -- -- (!) 112 -- --  07/09/22 1231 120/78 -- -- 86 -- --  07/09/22 1226 124/79 -- -- (!) 101 18 --  07/09/22 1221 131/85 -- -- 98 -- --  07/09/22 1216 124/81 -- -- 93 -- --  07/09/22 1211 117/82 -- -- 95 -- --  07/09/22 1209 125/80 -- -- 94 -- --  07/09/22 1206 132/86 -- -- (!) 113 -- --  07/09/22 1205 -- 98.2 F (36.8 C) Oral -- 18 --     Physical Exam:  General: alert, cooperative, and appears stated age Mood/Affect: ahppy Lungs: clear to auscultation, no wheezes, rales or rhonchi, symmetric air entry.  Heart: normal rate, regular rhythm, normal S1, S2, no murmurs, rubs, clicks or gallops. Breast: breasts appear normal, no  suspicious masses, no skin or nipple changes or axillary nodes. Abdomen:  + bowel sounds, soft, non-tender Incision: healing well, no significant drainage, no dehiscence, no significant erythema, Honeycomb dressing  Uterine Fundus: firm, involution -1 Lochia: appropriate Skin: Warm, Dry. DVT Evaluation: No evidence of DVT seen on physical exam. Negative Homan's sign. No cords or calf tenderness. No significant calf/ankle edema.  Labs: Recent Labs    07/09/22 0028 07/09/22 1825 07/10/22 0519  HGB 8.2* 7.3* 5.7*  HCT 26.9* 23.8* 18.7*  WBC 8.8 18.0* 20.3*    CBG (last 3)  No results for input(s): "GLUCAP" in the last 72 hours.   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 EBL was 633m, hgb drop of 8.2-7.3-5.7, R/B/A about needing blood transfusion given pt declined, but ok to get IV Iron, pt developed GHTN, BP 122/80 was on procardia 2/12, but since PP pt been normotensive and d/c procardia, asymptomatic, unable to get PCR r/t blood tinge urine.   Pt stable. -1 Involution. BreastFeeding. Hemodynamically Stable.  Plan: Continue other mgmt as ordered Aneima: IV venofer GHTN: monitor BP, no meds.  VTE Prophylactics: SCD, ambulated as tolerates.  Pain control: Motrin/Tylenol/Narcotics PRN Education given regarding options for contraception, including IUD placement.  Plan for discharge tomorrow, Breastfeeding, and Lactation consult  Dr. EbOrlie [name] be updated on patient status  Carrie [name], PMHNP-BC  [address] [id number]: [phone] Phone:  [phone]: [phone]/13/2024  12:04 PM

## 2022-07-10 NOTE — Lactation Note (Signed)
This note was copied from a baby's chart. Lactation Consultation Note  Patient Name: Carrie [name] [id number] Date: 07/10/2022  Reason for consult: Follow-up assessment;Term (infant with a weight gain 0 .80%) , Birth Parent feeding plan is to soley formula feeding  in hospital but plans to use her Personal DEBP when discharge from hospital. C/S delivery and Birth Parent with hx anemia receiving Feso4 see Birth Parent,  MR.  Age: 31 hours, term female infant.  Birth Parent shared her feeding goals , she doesn't want to latch infant to breast nor pump while in hospital. She Plans to formula feeding infant only  while in hospital this is her feeding choice. LC discussed with Birth Parent that using the DEBP since infant is  not latching at the breast , may help with breast  stimulation and  milk production. LC discussed after discharge maybe  pump every 3 hours and give infant back any EBM first before formula. LC put PRN so Lactation will not visit Birth Parent unless she requested to be seen. Day 2 of life to offer 15-30 mls or more of formula per feeding, formula supplementation given to [employer]. Birth Parent does have her own Personal wearable DEBP. LC gave "Storage and Preparation Sheet",  to Birth Parent when she decides to start using her DEBP. Per Birth Parent , she exclusively gave 1st child her pumped EBM for 3 months but never latch infant at the breast. Birth Parent doesn't have any questions or concerns for LC at this time. Birth Parent knows to call [facility] services  if  their are any  BF questions or concerns while in hospital.  Maternal Data    Feeding Mother's Current Feeding Choice: Breast Milk and Formula (Per Birth Parent, her goal is to pump when discharge but not in hospital) Nipple Type: Slow - flow  LATCH Score                    Lactation Tools Discussed/Used    Interventions Interventions: Hand express;Education;DEBP  Discharge Pump: Hands  Free;DEBP;Personal  Consult Status Consult Status: PRN Date: 07/10/22 Follow-up type: In-patient    Eulis Canner 07/10/2022, 5:08 PM

## 2022-07-10 NOTE — Progress Notes (Signed)
Pt ambulates to/fro bathroom standby assist, tolerated well. Pt states no dizziness, no SOB.Pt is encouraged to drink water.

## 2022-07-11 LAB — RUBELLA SCREEN: Rubella: 1.17 index (ref >0.99)

## 2022-07-11 LAB — SURGICAL PATHOLOGY

## 2022-07-11 NOTE — Progress Notes (Signed)
Subjective: Postpartum Day 2: Cesarean Delivery Patient reports incisional pain, tolerating PO, + flatus, and no problems voiding.  She is ambulating without any problems, denies dizziness or lightheadedness.    Objective: Vital signs in last 24 hours: Temp:  [98.1 F (36.7 C)-98.5 F (36.9 C)] 98.3 F (36.8 C) (02/14 0452) Pulse Rate:  [79-98] 79 (02/14 0452) Resp:  [16-18] 18 (02/14 0452) BP: (114-123)/(71-83) 123/83 (02/14 0452) SpO2:  [100 %] 100 % (02/13 1400)    07/11/2022   12:45 PM 07/11/2022    4:52 AM 07/10/2022   11:11 PM  Vitals with BMI  Systolic [id number]  Diastolic 78 83 71  Pulse 98 79 98    Physical Exam:  General: alert, cooperative, and no distress Lochia: appropriate Uterine Fundus: firm Incision: no significant drainage DVT Evaluation: No evidence of DVT seen on physical exam.  Recent Labs    07/09/22 1825 07/10/22 0519  HGB 7.3* 5.7*  HCT 23.8* 18.7*    Assessment/Plan: 32 y/o G3P2012 POD # 2 s/p primary cesarean section  for abnormal fetal heart tracing, Postoperative course complicated by postop severe anemia - Hgb of 5.7, patient is asymptomatic and declined a blood transfusion.  She received IV venofer x 1 dose and is on oral iron tablets.  Check CBC in AM.   - Chronic HTN and s/p procardia use, with normal recent blood pressures. 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   Archie Endo, MD 07/11/2022, 12:52 PM

## 2022-07-12 ENCOUNTER — Other Ambulatory Visit (HOSPITAL_COMMUNITY): Payer: Self-pay

## 2022-07-12 LAB — CBC
HCT: 14.7 % — ABNORMAL LOW (ref 36.0–46.0)
Hemoglobin: 4.5 g/dL — CL (ref 12.0–15.0)
MCH: 22.8 pg — ABNORMAL LOW (ref 26.0–34.0)
MCHC: 30.6 g/dL (ref 30.0–36.0)
MCV: 74.6 fL — ABNORMAL LOW (ref 80.0–100.0)
Platelets: 226 10*3/uL (ref 150–400)
RBC: 1.97 MIL/uL — ABNORMAL LOW (ref 3.87–5.11)
RDW: 17.3 % — ABNORMAL HIGH (ref 11.5–15.5)
WBC: 14.4 10*3/uL — ABNORMAL HIGH (ref 4.0–10.5)
nRBC: 1.9 % — ABNORMAL HIGH (ref 0.0–0.2)

## 2022-07-12 LAB — COMPREHENSIVE METABOLIC PANEL
ALT: 30 U/L (ref 0–44)
AST: 67 U/L — ABNORMAL HIGH (ref 15–41)
Albumin: 2.4 g/dL — ABNORMAL LOW (ref 3.5–5.0)
Alkaline Phosphatase: 105 U/L (ref 38–126)
Anion gap: 8 (ref 5–15)
BUN: 7 mg/dL (ref 6–20)
CO2: 25 mmol/L (ref 22–32)
Calcium: 8.1 mg/dL — ABNORMAL LOW (ref 8.9–10.3)
Chloride: 106 mmol/L (ref 98–111)
Creatinine, Ser: 0.76 mg/dL (ref 0.44–1.00)
GFR, Estimated: >60 mL/min (ref >60)
Glucose, Bld: 80 mg/dL (ref 70–99)
Potassium: 3 mmol/L — ABNORMAL LOW (ref 3.5–5.1)
Sodium: 139 mmol/L (ref 135–145)
Total Bilirubin: 1.1 mg/dL (ref 0.3–1.2)
Total Protein: 5.7 g/dL — ABNORMAL LOW (ref 6.5–8.1)

## 2022-07-12 LAB — PROTEIN / CREATININE RATIO, URINE
Creatinine, Urine: 38 mg/dL
Protein Creatinine Ratio: 0.21 mg/mg{Cre} — ABNORMAL HIGH (ref 0.00–0.15)
Total Protein, Urine: 8 mg/dL

## 2022-07-12 LAB — PREPARE RBC (CROSSMATCH)

## 2022-07-12 LAB — URIC ACID: Uric Acid, Serum: 5.1 mg/dL (ref 2.5–7.1)

## 2022-07-12 LAB — ABO/RH: ABO/RH(D): B POS

## 2022-07-12 LAB — LACTATE DEHYDROGENASE: LDH: 259 U/L — ABNORMAL HIGH (ref 98–192)

## 2022-07-12 MED ORDER — SODIUM CHLORIDE 0.9% IV SOLUTION: Freq: Once | INTRAVENOUS | Status: AC

## 2022-07-12 MED ORDER — LABETALOL HCL 5 MG/ML IV SOLN: 20.0000 mg | INTRAVENOUS | Status: DC | PRN

## 2022-07-12 MED ORDER — HYDROCHLOROTHIAZIDE 12.5 MG PO TABS
12.5000 mg | ORAL_TABLET | Freq: Once | ORAL | Status: AC
  Administered 2022-07-12: 12.5 mg via ORAL
  Filled 2022-07-12: qty 1

## 2022-07-12 MED ORDER — MAGNESIUM SULFATE 40 GM/1000ML IV SOLN
2.0000 g/h | INTRAVENOUS | Status: AC
  Administered 2022-07-13 (×2): 2 g/h via INTRAVENOUS
  Filled 2022-07-12 (×3): qty 1000

## 2022-07-12 MED ORDER — HYDRALAZINE HCL 20 MG/ML IJ SOLN: 10.0000 mg | INTRAMUSCULAR | Status: DC | PRN

## 2022-07-12 MED ORDER — SLYND 4 MG PO TABS
1.0000 | ORAL_TABLET | Freq: Every day | ORAL | 0 refills | Status: DC
  Filled 2022-07-12: qty 28, 28d supply, fill #0

## 2022-07-12 MED ORDER — LABETALOL HCL 5 MG/ML IV SOLN
INTRAVENOUS | Status: AC
  Filled 2022-07-12: qty 4

## 2022-07-12 MED ORDER — DIPHENHYDRAMINE HCL 25 MG PO CAPS
25.0000 mg | ORAL_CAPSULE | Freq: Once | ORAL | Status: AC
  Administered 2022-07-12: 25 mg via ORAL
  Filled 2022-07-12: qty 1

## 2022-07-12 MED ORDER — LABETALOL HCL 5 MG/ML IV SOLN
40.0000 mg | INTRAVENOUS | Status: DC | PRN
  Administered 2022-07-12: 40 mg via INTRAVENOUS
  Filled 2022-07-12 (×2): qty 8

## 2022-07-12 MED ORDER — LABETALOL HCL 5 MG/ML IV SOLN
20.0000 mg | INTRAVENOUS | Status: DC | PRN
  Administered 2022-07-12: 20 mg via INTRAVENOUS
  Filled 2022-07-12: qty 4

## 2022-07-12 MED ORDER — MAGNESIUM SULFATE BOLUS VIA INFUSION
4.0000 g | Freq: Once | INTRAVENOUS | Status: AC
  Administered 2022-07-13: 4 g via INTRAVENOUS
  Filled 2022-07-12: qty 1000

## 2022-07-12 MED ORDER — LACTATED RINGERS IV SOLN: INTRAVENOUS | Status: DC

## 2022-07-12 MED ORDER — LABETALOL HCL 5 MG/ML IV SOLN
80.0000 mg | INTRAVENOUS | Status: DC | PRN
  Administered 2022-07-12: 80 mg via INTRAVENOUS
  Filled 2022-07-12: qty 16

## 2022-07-12 NOTE — Progress Notes (Signed)
Hgb 4.5, Hct 14.7, vital sign stable, patient asymptomatic. Notified Dr. Mancel Bale.Will be in to see patient this AM.

## 2022-07-12 NOTE — Progress Notes (Signed)
Per Dr. Mancel Bale, call MD if patient's systolic pressure is Q000111Q or greater and/or if patient's diastolic pressure is 123XX123 or greater.

## 2022-07-12 NOTE — Progress Notes (Signed)
Subjective: Postpartum Day 3: Cesarean Delivery Patient reports tolerating PO and no problems voiding.   Pt looks pale but says she feels like she normally does but does not feel like she can exert herself either.    Objective: Vital signs in last 24 hours: Temp:  [98.2 F (36.8 C)-98.5 F (36.9 C)] 98.5 F (36.9 C) (02/15 0544) Pulse Rate:  [80-81] 81 (02/15 0544) Resp:  [16-17] 17 (02/15 0544) BP: (116-125)/(83-86) 116/83 (02/15 0544) SpO2:  [100 %] 100 % (02/15 0544)  Physical Exam:  General: alert and no distress Lochia: appropriate Uterine Fundus: FF, NT Incision: dressing c/d/i DVT Evaluation: no calf tenderness  Recent Labs    07/10/22 0519 07/12/22 0704  HGB 5.7* 4.5*  HCT 18.7* 14.7*    Assessment/Plan: Status post Cesarean section. Doing well postoperatively. Severe Anemia   Pt agreeable to proceed with blood transfusion.  Risks benefits and alternatives reviewed. Questions answered Pt has a h/o menorrhagia prior to pregnancy that was uncontrolled.  Discussed options.  Will start slynd now while in hospital.  Delice Lesch, MD 07/12/2022, 1:20 PM

## 2022-07-12 NOTE — Progress Notes (Addendum)
Subjective: Postpartum Day 3: Cesarean Delivery Patient reports Ha, no visual changes and epigastric pain upon my arrival.  I was called by RN with report of severe range BPs.  Pt is s/p 3u PRBCs.  She was feeling slightly SOB which she said she felt prior to 1st unit of blood and reports sharp CP 8/10 that improved after BP was better controlled after 77m IV labetalol.  Pt also reports HA has resolved as well once BPs improved and s/p tylenol as well.  Epigastric pain resolved.  Objective: Vital signs in last 24 hours: Temp:  [98.1 F (36.7 C)-98.9 F (37.2 C)] 98.9 F (37.2 C) (02/15 2216) Pulse Rate:  [59-102] 85 (02/15 2314) Resp:  [17-20] 20 (02/15 2216) BP: (116-193)/(79-105) 179/97 (02/15 2324) SpO2:  [99 %-100 %] 100 % (02/15 2314) UOP 250cc prior to leaving 4th floor  Physical Exam:  General: alert and mild distress Lungs CTA Lochia: appropriate Uterine Fundus: FF, NT Incision: dressing c/d/i DVT Evaluation: no calf tenderness  Recent Labs    07/10/22 0519 07/12/22 0704  HGB 5.7* 4.5*  HCT 18.7* 14.7*    Assessment/Plan: Status post Cesarean section. Postoperative course complicated by Severe anemia once corrected unmasked severe preeclampsia.   AST mildly elevated 67 ALT 30 Hgb 9.8 HCTZ given as order by phone when RN called IV labetalol protocol ordered by phone as well Pt now transferred to ob specialty care where MgSO4 will be given.   Strict Is/Os Will cont to monitor closely and repeat labs in 6hrs SCDs  ADelice Lesch MD 07/12/2022, 11:28 PM

## 2022-07-13 ENCOUNTER — Other Ambulatory Visit: Payer: Self-pay

## 2022-07-13 LAB — TYPE AND SCREEN
ABO/RH(D): B POS
Antibody Screen: NEGATIVE
Unit division: 0
Unit division: 0
Unit division: 0

## 2022-07-13 LAB — CBC
HCT: 30 % — ABNORMAL LOW (ref 36.0–46.0)
HCT: 28.6 % — ABNORMAL LOW (ref 36.0–46.0)
Hemoglobin: 9.8 g/dL — ABNORMAL LOW (ref 12.0–15.0)
Hemoglobin: 9.4 g/dL — ABNORMAL LOW (ref 12.0–15.0)
MCH: 25.8 pg — ABNORMAL LOW (ref 26.0–34.0)
MCH: 26 pg (ref 26.0–34.0)
MCHC: 32.7 g/dL (ref 30.0–36.0)
MCHC: 32.9 g/dL (ref 30.0–36.0)
MCV: 78.9 fL — ABNORMAL LOW (ref 80.0–100.0)
MCV: 79 fL — ABNORMAL LOW (ref 80.0–100.0)
Platelets: 273 10*3/uL (ref 150–400)
Platelets: 293 10*3/uL (ref 150–400)
RBC: 3.8 MIL/uL — ABNORMAL LOW (ref 3.87–5.11)
RBC: 3.62 MIL/uL — ABNORMAL LOW (ref 3.87–5.11)
RDW: 16 % — ABNORMAL HIGH (ref 11.5–15.5)
RDW: 15.9 % — ABNORMAL HIGH (ref 11.5–15.5)
WBC: 14.7 10*3/uL — ABNORMAL HIGH (ref 4.0–10.5)
WBC: 15.8 10*3/uL — ABNORMAL HIGH (ref 4.0–10.5)
nRBC: 5.5 % — ABNORMAL HIGH (ref 0.0–0.2)
nRBC: 4.2 % — ABNORMAL HIGH (ref 0.0–0.2)

## 2022-07-13 LAB — BPAM RBC
Blood Product Expiration Date: 202402282359
Blood Product Expiration Date: 202402282359
Blood Product Expiration Date: 202403012359
ISSUE DATE / TIME: 202402151453
ISSUE DATE / TIME: 202402151750
ISSUE DATE / TIME: 202402152022
Unit Type and Rh: 7300
Unit Type and Rh: 7300
Unit Type and Rh: 7300

## 2022-07-13 LAB — MAGNESIUM: Magnesium: 6 mg/dL — ABNORMAL HIGH (ref 1.7–2.4)

## 2022-07-13 LAB — COMPREHENSIVE METABOLIC PANEL
ALT: 33 U/L (ref 0–44)
AST: 64 U/L — ABNORMAL HIGH (ref 15–41)
Albumin: 2.4 g/dL — ABNORMAL LOW (ref 3.5–5.0)
Alkaline Phosphatase: 110 U/L (ref 38–126)
Anion gap: 12 (ref 5–15)
BUN: 5 mg/dL — ABNORMAL LOW (ref 6–20)
CO2: 26 mmol/L (ref 22–32)
Calcium: 7.9 mg/dL — ABNORMAL LOW (ref 8.9–10.3)
Chloride: 101 mmol/L (ref 98–111)
Creatinine, Ser: 0.78 mg/dL (ref 0.44–1.00)
GFR, Estimated: >60 mL/min (ref >60)
Glucose, Bld: 104 mg/dL — ABNORMAL HIGH (ref 70–99)
Potassium: 2.6 mmol/L — CL (ref 3.5–5.1)
Sodium: 139 mmol/L (ref 135–145)
Total Bilirubin: 0.4 mg/dL (ref 0.3–1.2)
Total Protein: 5.4 g/dL — ABNORMAL LOW (ref 6.5–8.1)

## 2022-07-13 MED ORDER — POTASSIUM CHLORIDE 10 MEQ/100ML IV SOLN: 10.0000 meq | INTRAVENOUS | Status: DC

## 2022-07-13 MED ORDER — POTASSIUM CHLORIDE 20 MEQ PO PACK
20.0000 meq | PACK | Freq: Two times a day (BID) | ORAL | Status: DC
  Filled 2022-07-13 (×2): qty 1

## 2022-07-13 MED ORDER — POTASSIUM CHLORIDE CRYS ER 20 MEQ PO TBCR
40.0000 meq | EXTENDED_RELEASE_TABLET | Freq: Two times a day (BID) | ORAL | Status: DC
  Administered 2022-07-13 – 2022-07-16 (×7): 40 meq via ORAL
  Filled 2022-07-13 (×7): qty 2

## 2022-07-13 MED ORDER — POTASSIUM CHLORIDE 10 MEQ/100ML IV SOLN
10.0000 meq | INTRAVENOUS | Status: AC
  Administered 2022-07-14 (×2): 10 meq via INTRAVENOUS
  Filled 2022-07-13 (×2): qty 100

## 2022-07-13 NOTE — Progress Notes (Signed)
Subjective: Postpartum Day 4: Cesarean Delivery Patient reports incisional pain, tolerating PO, + flatus, and + BM.  No HA or blurred vision  Objective: Vital signs in last 24 hours: Temp:  [97.5 F (36.4 C)-98.9 F (37.2 C)] 97.5 F (36.4 C) (02/16 1212) Pulse Rate:  [59-102] 85 (02/16 1212) Resp:  [16-20] 17 (02/16 1212) BP: (98-193)/(50-105) 131/84 (02/16 1212) SpO2:  [91 %-100 %] 99 % (02/16 1212)  Physical Exam:  General: alert and cooperative Lochia: appropriate Uterine Fundus: firm Incision: healing well, no significant drainage DVT Evaluation: No evidence of DVT seen on physical exam.  Recent Labs    07/12/22 2251 07/13/22 0540  HGB 9.8* 9.4*  HCT 30.0* 28.6*   CMP     Component Value Date/Time   NA 139 07/13/2022 0540   K 2.6 (LL) 07/13/2022 0540   CL 101 07/13/2022 0540   CO2 26 07/13/2022 0540   GLUCOSE 104 (H) 07/13/2022 0540   BUN 5 (L) 07/13/2022 0540   CREATININE 0.78 07/13/2022 0540   CALCIUM 7.9 (L) 07/13/2022 0540   PROT 5.4 (L) 07/13/2022 0540   ALBUMIN 2.4 (L) 07/13/2022 0540   AST 64 (H) 07/13/2022 0540   ALT 33 07/13/2022 0540   ALKPHOS 110 07/13/2022 0540   BILITOT 0.4 07/13/2022 0540   GFRNONAA >60 07/13/2022 0540    Assessment/Plan: Status post Cesarean section. Postoperative course complicated by severe PE and and anemia Pt on Magnesium sulfate.  She is stable .  BP stable.  Will check labs tomorrow S/p 3u PRBC.  Continue iron Low potassium.  Will replace Iv and orally  Continue current care.  Betsy Coder, MD 07/13/2022, 2:17 PM

## 2022-07-13 NOTE — Plan of Care (Signed)
  Problem: Education: Goal: Knowledge of General Education information will improve Description: Including pain rating scale, medication(s)/side effects and non-pharmacologic comfort measures Outcome: Completed/Met   Problem: Coping: Goal: Level of anxiety will decrease Outcome: Completed/Met   Problem: Elimination: Goal: Will not experience complications related to bowel motility Outcome: Completed/Met   Problem: Education: Goal: Knowledge of the prescribed therapeutic regimen will improve Outcome: Completed/Met Goal: Understanding of sexual limitations or changes related to disease process or condition will improve Outcome: Completed/Met   Problem: Education: Goal: Knowledge of condition will improve Outcome: Completed/Met   Problem: Activity: Goal: Will verbalize the importance of balancing activity with adequate rest periods Outcome: Completed/Met Goal: Ability to tolerate increased activity will improve Outcome: Completed/Met   Problem: Coping: Goal: Ability to identify and utilize available resources and services will improve Outcome: Completed/Met   Problem: Life Cycle: Goal: Chance of risk for complications during the postpartum period will decrease Outcome: Completed/Met   Problem: Role Relationship: Goal: Ability to demonstrate positive interaction with newborn will improve Outcome: Completed/Met   Problem: Education: Goal: Knowledge of condition will improve Outcome: Completed/Met   Problem: Activity: Goal: Will verbalize the importance of balancing activity with adequate rest periods Outcome: Completed/Met   Problem: Coping: Goal: Ability to identify and utilize available resources and services will improve Outcome: Completed/Met   Problem: Role Relationship: Goal: Ability to demonstrate positive interaction with newborn will improve Outcome: Completed/Met   Problem: Education: Goal: Knowledge of disease or condition will improve Outcome:  Completed/Met Goal: Knowledge of the prescribed therapeutic regimen will improve Outcome: Completed/Met

## 2022-07-13 NOTE — Progress Notes (Addendum)
I contacted Dr. Mancel Bale at 2045 after the patient had two high pressure (146/85 and 149/92). Dr. Mancel Bale told me to contact her again if the patient had systolic pressures [id number] or above and/or diastolic pressures [id number] or above. I took the patient's vitals again at 2216 after the third blood transfusion was complete and her bp was 160/87. I contacted Dr. Mancel Bale again at this time and she gave me new orders. I ordered a CBC, CMP, clean catch PCR, LDH, uric acid, and hydrochlorothiazide 12.24m. Patient called out complaining of chest pain, SOB, feels like her heart is pounding, severe headache. I took her vitals again and her bp was 187/102. I contacted Dr. RMancel Baleagain at this time. Me and other RN stayed in the room with the patient. Dr. RMancel Baleordered the labs STAT, labetalol protocol, and requested that the patient be put on magnesium as well. She indicated that she was en route. Blood pressure was taken at 2314 and it was 193/105, we gave 20 mg IV labetalol and MD entered room at this time and stayed with patient. We continued through the entire labetalol protocol due to pressures. As soon as we got a stable pressure, this RN and MD [facility]

## 2022-07-14 ENCOUNTER — Encounter (HOSPITAL_COMMUNITY): Payer: Self-pay | Admitting: Obstetrics and Gynecology

## 2022-07-14 DIAGNOSIS — O1415 Severe pre-eclampsia, complicating the puerperium: Secondary | ICD-10-CM | POA: Diagnosis not present

## 2022-07-14 DIAGNOSIS — E876 Hypokalemia: Secondary | ICD-10-CM | POA: Diagnosis not present

## 2022-07-14 LAB — CBC
HCT: 28.8 % — ABNORMAL LOW (ref 36.0–46.0)
Hemoglobin: 9.3 g/dL — ABNORMAL LOW (ref 12.0–15.0)
MCH: 25.3 pg — ABNORMAL LOW (ref 26.0–34.0)
MCHC: 32.3 g/dL (ref 30.0–36.0)
MCV: 78.3 fL — ABNORMAL LOW (ref 80.0–100.0)
Platelets: 294 10*3/uL (ref 150–400)
RBC: 3.68 MIL/uL — ABNORMAL LOW (ref 3.87–5.11)
RDW: 17.1 % — ABNORMAL HIGH (ref 11.5–15.5)
WBC: 11.7 10*3/uL — ABNORMAL HIGH (ref 4.0–10.5)
nRBC: 1.5 % — ABNORMAL HIGH (ref 0.0–0.2)

## 2022-07-14 LAB — COMPREHENSIVE METABOLIC PANEL
ALT: 32 U/L (ref 0–44)
ALT: 30 U/L (ref 0–44)
AST: 48 U/L — ABNORMAL HIGH (ref 15–41)
AST: 34 U/L (ref 15–41)
Albumin: 2.4 g/dL — ABNORMAL LOW (ref 3.5–5.0)
Albumin: 2.5 g/dL — ABNORMAL LOW (ref 3.5–5.0)
Alkaline Phosphatase: 97 U/L (ref 38–126)
Alkaline Phosphatase: 97 U/L (ref 38–126)
Anion gap: 11 (ref 5–15)
Anion gap: 8 (ref 5–15)
BUN: 5 mg/dL — ABNORMAL LOW (ref 6–20)
BUN: 5 mg/dL — ABNORMAL LOW (ref 6–20)
CO2: 26 mmol/L (ref 22–32)
CO2: 25 mmol/L (ref 22–32)
Calcium: 6.3 mg/dL — CL (ref 8.9–10.3)
Calcium: 6.8 mg/dL — ABNORMAL LOW (ref 8.9–10.3)
Chloride: 101 mmol/L (ref 98–111)
Chloride: 104 mmol/L (ref 98–111)
Creatinine, Ser: 0.68 mg/dL (ref 0.44–1.00)
Creatinine, Ser: 0.64 mg/dL (ref 0.44–1.00)
GFR, Estimated: >60 mL/min (ref >60)
GFR, Estimated: >60 mL/min (ref >60)
Glucose, Bld: 101 mg/dL — ABNORMAL HIGH (ref 70–99)
Glucose, Bld: 93 mg/dL (ref 70–99)
Potassium: 2.6 mmol/L — CL (ref 3.5–5.1)
Potassium: 3.5 mmol/L (ref 3.5–5.1)
Sodium: 138 mmol/L (ref 135–145)
Sodium: 137 mmol/L (ref 135–145)
Total Bilirubin: 0.3 mg/dL (ref 0.3–1.2)
Total Bilirubin: 0.3 mg/dL (ref 0.3–1.2)
Total Protein: 5.5 g/dL — ABNORMAL LOW (ref 6.5–8.1)
Total Protein: 5.5 g/dL — ABNORMAL LOW (ref 6.5–8.1)

## 2022-07-14 MED ORDER — POTASSIUM CHLORIDE 10 MEQ/100ML IV SOLN
10.0000 meq | INTRAVENOUS | Status: AC
  Administered 2022-07-14 (×6): 10 meq via INTRAVENOUS
  Filled 2022-07-14 (×6): qty 100

## 2022-07-14 MED ORDER — CALCIUM CARBONATE ANTACID 500 MG PO CHEW
1.0000 | CHEWABLE_TABLET | Freq: Two times a day (BID) | ORAL | 0 refills | Status: DC
  Filled 2022-07-14: qty 14, 7d supply, fill #0

## 2022-07-14 MED ORDER — NIFEDIPINE ER 30 MG PO TB24: 30.0000 mg | ORAL_TABLET | Freq: Every day | ORAL | 1 refills | Status: DC

## 2022-07-14 MED ORDER — CALCIUM CARBONATE ANTACID 500 MG PO CHEW
1.0000 | CHEWABLE_TABLET | Freq: Two times a day (BID) | ORAL | Status: DC
  Administered 2022-07-14 – 2022-07-16 (×4): 200 mg via ORAL
  Filled 2022-07-14 (×4): qty 1

## 2022-07-14 MED ORDER — POLYSACCHARIDE IRON COMPLEX 150 MG PO CAPS: 150.0000 mg | ORAL_CAPSULE | Freq: Every day | ORAL | 1 refills | Status: AC

## 2022-07-14 MED ORDER — IBUPROFEN 600 MG PO TABS
600.0000 mg | ORAL_TABLET | Freq: Four times a day (QID) | ORAL | 0 refills | Status: DC
  Filled 2022-07-14: qty 30, 8d supply, fill #0

## 2022-07-14 MED ORDER — OXYCODONE HCL 5 MG PO TABS: 5.0000 mg | ORAL_TABLET | Freq: Four times a day (QID) | ORAL | 0 refills | Status: AC | PRN

## 2022-07-14 MED ORDER — NIFEDIPINE ER OSMOTIC RELEASE 30 MG PO TB24
30.0000 mg | ORAL_TABLET | Freq: Every day | ORAL | Status: DC
  Administered 2022-07-14 – 2022-07-15 (×2): 30 mg via ORAL
  Filled 2022-07-14 (×2): qty 1

## 2022-07-14 MED ORDER — NIFEDIPINE ER 30 MG PO TB24
30.0000 mg | ORAL_TABLET | Freq: Every day | ORAL | 1 refills | Status: DC
  Filled 2022-07-14: qty 30, 30d supply, fill #0

## 2022-07-14 MED ORDER — IBUPROFEN 600 MG PO TABS: 600.0000 mg | ORAL_TABLET | Freq: Four times a day (QID) | ORAL | 0 refills | Status: AC

## 2022-07-14 MED ORDER — SLYND 4 MG PO TABS: 1.0000 | ORAL_TABLET | Freq: Every day | ORAL | 0 refills | Status: AC

## 2022-07-14 MED ORDER — POLYSACCHARIDE IRON COMPLEX 150 MG PO CAPS
150.0000 mg | ORAL_CAPSULE | Freq: Every day | ORAL | Status: DC
  Administered 2022-07-15 – 2022-07-16 (×2): 150 mg via ORAL
  Filled 2022-07-14 (×2): qty 1

## 2022-07-14 MED ORDER — POLYSACCHARIDE IRON COMPLEX 150 MG PO CAPS
150.0000 mg | ORAL_CAPSULE | Freq: Every day | ORAL | 1 refills | Status: DC
  Filled 2022-07-14: qty 30, 30d supply, fill #0

## 2022-07-14 MED ORDER — CALCIUM CARBONATE ANTACID 500 MG PO CHEW: 1.0000 | CHEWABLE_TABLET | Freq: Two times a day (BID) | ORAL | 0 refills | Status: AC

## 2022-07-14 NOTE — Progress Notes (Signed)
Date and time results received: 07/14/22 0618  (use smartphrase ".now" to insert current time)  Test: Potassium 2.6, Calcium 6.3 Critical Value:   Latest Reference Range & Units 07/14/22 04:10  Potassium 3.5 - 5.1 mmol/L 2.6 (LL)  Chloride 98 - 111 mmol/L 101  CO2 22 - 32 mmol/L 26  Glucose 70 - 99 mg/dL 101 (H)  BUN 6 - 20 mg/dL 5 (L)  Creatinine 0.44 - 1.00 mg/dL 0.68  Calcium 8.9 - 10.3 mg/dL 6.3 (LL)  (LL): Data is critically low (H): Data is abnormally high (L): Data is abnormally low  Name of Provider Notified: Burman Foster, CNM  Orders Received? none Or Actions Taken?: none

## 2022-07-14 NOTE — Discharge Summary (Incomplete)
PCS OB Discharge Summary    Date of discharge 07/16/22    Patient Name: Carrie [name] DOB: [birth year] MRN: [id number]  Date of admission: 07/09/2022 Delivering MD: Crawford Givens  Date of delivery: 07/09/2022 Type of delivery: PCS  Newborn Data: Sex: Baby [name] Circumcision: Deferred r/t cordee Live born [name]  Birth Weight: 7 lb 2.3 oz (3240 g) APGAR: 93, 9  Newborn Delivery   Birth date/time: 07/09/2022 17:16:00 Delivery type: C-Section, Low Transverse Trial of labor: Yes C-section categorization: Primary      Feeding: breast and bottle Infant being discharge to home with mother in stable condition.   Admitting diagnosis: Post term pregnancy [O48.0] Intrauterine pregnancy: [redacted]w[redacted]d    Secondary diagnosis:  Active Problems:   Encounter for induction of labor   S/P cesarean section   Acute blood loss anemia   Gestational hypertension   Hypertension in pregnancy, preeclampsia, severe, delivered/postpartum   Hypocalcemia   Hypokalemia   SVD (spontaneous vaginal delivery)                                Complications: None                                                              Intrapartum Procedures: cesarean: low cervical, transverse Postpartum Procedures: transfusion IV iron and  3 unit RBC, potassium replacement Complications-Operative and Postpartum: none Augmentation: AROM, Pitocin, and Cytotec   History of Present Illness: Ms. Carrie [name] a 32y.o. [name], [id number] who presents at 430w0deeks gestation for induction of labor at term. Her pregnancy has been complicated by:  Patient Active Problem List   Diagnosis Date Noted   Hypertension in pregnancy, preeclampsia, severe, delivered/postpartum 07/14/2022   Hypocalcemia 07/14/2022   Hypokalemia 07/14/2022   SVD (spontaneous vaginal delivery) 07/14/2022   Encounter for induction of labor 07/10/2022   S/P cesarean section 07/10/2022   Acute blood loss anemia 07/10/2022   Gestational hypertension  07/10/2022     Hospital course:  Induction of Labor With Vaginal Delivery   [age].o. yo [id number] 4120w0ds admitted to the hospital 07/09/2022 for induction of labor.  Indication for induction: Postdates.  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 EBL was 639ml68mgb drop of 8.2-7.3-5.7 received IV iron ,then dropped to 5.7-4.5 and received 3 unit RBC, and resulted with 9.3, asymptomatic,  pt developed GHTN, BP 122/80 was on procardia 2/12, but since PP pt been normotensive and d/c procardia, asymptomatic, unable to get PCR r/t blood tinge urine. Then PP pt delivered elevated BP, PCR was 0.21, slightly elevated ast/alt, and was dx with preE and currently s/p 24 hours mag, was started back on procardia 30mg 45mn 2/17 trended down and resolved, and BP became normotensive on procardia 30, then 2/18 BP crept up again added another 30mg o1mocardia for daily target dose of 60mg da65m will monitor BP for 4 more hours until normal range then may  go home. and developed hypocalemia 8.1-7.9-6.3 was placed on calcemia and went up to 8.1 asymptomatic went home with tums for BID x7 days, also hypokalemia 3.0-2.6-2.6 had x6 runs of K and corrected at 3.6, asymptomatic, pt currently stable and will go home today.  Membrane Rupture Time/Date: 10:09 AM ,07/09/2022   Delivery Method:C-Section, Low Transverse  Episiotomy: None  Lacerations:  None  Details of delivery can be found in separate delivery note or below link.   Review the Delivery Report for details.  Patient is discharged home 07/16/22.  Newborn Data: Birth date:07/09/2022  Birth time:5:16 PM  Gender:[name]  Living status:Living  Apgars:8 ,9  Weight:3240 g  Postoperative Day # 7 : Patient up  ad lib, denies syncope or dizziness, caring for self and newborn. Reports consuming regular diet without issues and denies N/V. Patient reports several bowel movements since birth + passing flatus.  Denies issues with urination and reports bleeding scant without clots. Patient is breastfeeding and reports going well.  Desires POP for postpartum contraception.  Pain is being appropriately managed with use of po meds. Denies HA, RUQ pain or vision changes.     Physical exam  Vitals:   07/16/22 0345 07/16/22 0924 07/16/22 1143 07/16/22 1213  BP: 128/79 (!) 126/93 125/83 128/80  Pulse: 79 77 94 (!) 110  Resp:  16  16  Temp:  98.7 F (37.1 C)  98.1 F (36.7 C)  TempSrc:  Oral  Oral  SpO2:  100%  100%  Weight:      Height:       General: alert Lochia: appropriate Uterine Fundus: firm Incision: Healing well with no significant drainage, No significant erythema, Dressing removed Perineum: intact DVT Evaluation: No evidence of DVT seen on physical exam. Negative Homan's sign. No cords or calf tenderness. No significant calf/ankle edema.  Labs: Lab Results  Component Value Date   WBC 11.7 (H) 07/16/2022   HGB 11.9 (L) 07/16/2022   HCT 36.9 07/16/2022   MCV 80.9 07/16/2022   PLT 392 07/16/2022      Latest Ref Rng & Units 07/16/2022    5:24 AM  CMP  Glucose 70 - 99 mg/dL 98   BUN 6 - 20 mg/dL 9   Creatinine 0.44 - 1.00 mg/dL 0.72   Sodium 135 - 145 mmol/L 136   Potassium 3.5 - 5.1 mmol/L 4.0   Chloride 98 - 111 mmol/L 100   CO2 22 - 32 mmol/L 23   Calcium 8.9 - 10.3 mg/dL 9.2   Total Protein 6.5 - 8.1 g/dL 6.7   Total Bilirubin 0.3 - 1.2 mg/dL 0.6   Alkaline Phos 38 - 126 U/L 108   AST 15 - 41 U/L 24   ALT 0 - 44 U/L 26     Date of discharge: 07/16/2022 Discharge Diagnoses: Term Pregnancy-delivered and Preelampsia Discharge instruction: per After Visit Summary and "Baby and Me Booklet".  Activity:           unrestricted and pelvic rest Advance as tolerated. Pelvic rest  for 6 weeks.  Diet:                routine Medications: PNV, Ibuprofen, Colace, Iron, and oxy IR, iron, procardia 23m xl, tums BID x7 days Postpartum contraception: Progesterone only pills Condition:  Pt discharge to home with baby in stable condition  One week BP check with [facility]  Discharge Follow Up:   [facility]  Specialty: Obstetrics and Gynecology Why: 1 week BP check and 6 weeks PPV Contact information: [address] [phone] [place], [place], PMHNP-BC  [address] [place]  [place], [place] [address]  Cell: [phone]  Office Phone: [phone] Fax: [phone] 07/15/2022  12:34 PM  Addendum: 4:26 PM, 07/15/22 RN called and report BP elevated even with started on 32m procardia xl, dr dDelora Fuelconsulted recommended pt staying another night to continue to assess, if BP continue will most likely add HCTZ to regimen.  BP (!) 153/82   Pulse 60   Temp 98.4 F (36.9 C) (Oral)   Resp 18   Ht 5' (1.524 m)   Wt 60.9 kg   SpO2 98%   Breastfeeding Unknown   BMI 26.21 kg/m  ADDENDUM Procardia increased from 60 to 90 mg PO daily. Mild range BP with increase in dosage. Pt discharged home today with close follow-up in 3 days. Pre  VBurman Foster DNP, CNM 07/16/2022 2:37 PM

## 2022-07-14 NOTE — Progress Notes (Signed)
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  Reports feeling very good, denies HA, visual changes, and RUQ pain Feeding: breast and bottle Reports tolerating PO and denies N/V, foley removed, ambulating and urinating w/o difficulty  Pain controlled with  PO meds Denies HA/SOB/dizziness  Flatus passing Vaginal bleeding is normal, no clots     Objective:  VS:  Vitals:   07/14/22 0000 07/14/22 0100 07/14/22 0401 07/14/22 0737  BP: (!) 144/92  129/87 134/89  Pulse: 75  79 72  Resp: 18 16 16 18  $ Temp: 97.8 F (36.6 C)  97.8 F (36.6 C) 98.2 F (36.8 C)  TempSrc: Oral  Oral Oral  SpO2: 99%  98% 96%  Weight:      Height:        Intake/Output Summary (Last 24 hours) at 07/14/2022 0801 Last data filed at 07/14/2022 [id number] Gross per 24 hour  Intake 4699.07 ml  Output 6150 ml  Net -1450.93 ml     Recent Labs    07/13/22 0540 07/14/22 0410  WBC 15.8* 11.7*  HGB 9.4* 9.3*  HCT 28.6* 28.8*  PLT 293 294    Blood type: --/--/B POS Performed at [facility], [place] [address], [place], [place] [address]  [phone]) Rubella: 1.17 (02/12 1825)    Physical Exam:  General: alert, cooperative, and no distress CV: Regular rate and rhythm Resp: unlabored Abdomen: soft, nontender, normal bowel sounds Incision: clean, dry, and intact Perineum:  Uterine Fundus: firm, below umbilicus, nontender Lochia: minimal Ext: extremities normal, atraumatic, no cyanosis or edema   Assessment/Plan: 32 y.o.   POD# 5. [id number]                  Principal Problem:   Post term pregnancy Active Problems:   Encounter for induction of labor   S/P cesarean section   Acute blood loss anemia   Gestational hypertension   Hypertension in pregnancy, preeclampsia, severe, delivered/postpartum   Hypocalcemia   Hypokalemia  Magnesium sulfate completed @ 0030, mild range BP  continues S/P Potassium x2 runs  Will discuss exam and labs with Dr. Delora Fuel to determine [place] for discharge  Carrie Eastern, DNP, CNM 07/14/2022, 8:01 AM

## 2022-07-15 LAB — COMPREHENSIVE METABOLIC PANEL
ALT: 29 U/L (ref 0–44)
AST: 27 U/L (ref 15–41)
Albumin: 2.7 g/dL — ABNORMAL LOW (ref 3.5–5.0)
Alkaline Phosphatase: 111 U/L (ref 38–126)
Anion gap: 9 (ref 5–15)
BUN: 5 mg/dL — ABNORMAL LOW (ref 6–20)
CO2: 24 mmol/L (ref 22–32)
Calcium: 8.1 mg/dL — ABNORMAL LOW (ref 8.9–10.3)
Chloride: 105 mmol/L (ref 98–111)
Creatinine, Ser: 0.6 mg/dL (ref 0.44–1.00)
GFR, Estimated: >60 mL/min (ref >60)
Glucose, Bld: 76 mg/dL (ref 70–99)
Potassium: 3.4 mmol/L — ABNORMAL LOW (ref 3.5–5.1)
Sodium: 138 mmol/L (ref 135–145)
Total Bilirubin: 0.3 mg/dL (ref 0.3–1.2)
Total Protein: 6.3 g/dL — ABNORMAL LOW (ref 6.5–8.1)

## 2022-07-15 MED ORDER — HYDRALAZINE HCL 20 MG/ML IJ SOLN
5.0000 mg | INTRAMUSCULAR | Status: DC | PRN
  Administered 2022-07-15: 5 mg via INTRAVENOUS
  Filled 2022-07-15: qty 1

## 2022-07-15 MED ORDER — HYDRALAZINE HCL 20 MG/ML IJ SOLN: 10.0000 mg | INTRAMUSCULAR | Status: DC | PRN

## 2022-07-15 MED ORDER — NIFEDIPINE ER OSMOTIC RELEASE 30 MG PO TB24
30.0000 mg | ORAL_TABLET | Freq: Once | ORAL | Status: AC
  Administered 2022-07-15: 30 mg via ORAL
  Filled 2022-07-15: qty 1

## 2022-07-15 MED ORDER — NIFEDIPINE ER OSMOTIC RELEASE 30 MG PO TB24: 30.0000 mg | ORAL_TABLET | Freq: Every day | ORAL | Status: DC

## 2022-07-15 MED ORDER — LABETALOL HCL 5 MG/ML IV SOLN: 20.0000 mg | INTRAVENOUS | Status: DC | PRN

## 2022-07-15 MED ORDER — NIFEDIPINE ER OSMOTIC RELEASE 60 MG PO TB24: 60.0000 mg | ORAL_TABLET | Freq: Every day | ORAL | Status: DC

## 2022-07-15 MED ORDER — LABETALOL HCL 5 MG/ML IV SOLN: 40.0000 mg | INTRAVENOUS | Status: DC | PRN

## 2022-07-15 MED ORDER — NIFEDIPINE ER 60 MG PO TB24: 60.0000 mg | ORAL_TABLET | Freq: Every day | ORAL | 1 refills | Status: DC

## 2022-07-15 MED ORDER — NIFEDIPINE ER OSMOTIC RELEASE 60 MG PO TB24
90.0000 mg | ORAL_TABLET | Freq: Every day | ORAL | Status: DC
  Administered 2022-07-16: 90 mg via ORAL
  Filled 2022-07-15: qty 1

## 2022-07-15 NOTE — Plan of Care (Signed)
  Problem: Health Behavior/Discharge Planning: Goal: Ability to manage health-related needs will improve Outcome: Completed/Met   Problem: Clinical Measurements: Goal: Will remain free from infection Outcome: Completed/Met Goal: Diagnostic test results will improve Outcome: Completed/Met   Problem: Pain Managment: Goal: General experience of comfort will improve Outcome: Completed/Met   Problem: Skin Integrity: Goal: Risk for impaired skin integrity will decrease Outcome: Completed/Met   Problem: Education: Goal: Knowledge of Childbirth will improve Outcome: Completed/Met Goal: Ability to make informed decisions regarding treatment and plan of care will improve Outcome: Completed/Met Goal: Ability to state and carry out methods to decrease the pain will improve Outcome: Completed/Met Goal: Individualized Educational Video(s) Outcome: Completed/Met   Problem: Coping: Goal: Ability to verbalize concerns and feelings about labor and delivery will improve Outcome: Completed/Met   Problem: Life Cycle: Goal: Ability to make normal progression through stages of labor will improve Outcome: Completed/Met Goal: Ability to effectively push during vaginal delivery will improve Outcome: Completed/Met   Problem: Role Relationship: Goal: Will demonstrate positive interactions with the child Outcome: Completed/Met   Problem: Safety: Goal: Risk of complications during labor and delivery will decrease Outcome: Completed/Met   Problem: Pain Management: Goal: Relief or control of pain from uterine contractions will improve Outcome: Completed/Met   Problem: Education: Goal: Individualized Educational Video(s) Outcome: Completed/Met   Problem: Self-Concept: Goal: Communication of feelings regarding changes in body function or appearance will improve Outcome: Completed/Met   Problem: Skin Integrity: Goal: Demonstration of wound healing without infection will improve Outcome:  Completed/Met   Problem: Education: Goal: Individualized Educational Video(s) Outcome: Completed/Met Goal: Individualized Newborn Educational Video(s) Outcome: Completed/Met   Problem: Skin Integrity: Goal: Demonstration of wound healing without infection will improve Outcome: Completed/Met   Problem: Education: Goal: Individualized Educational Video(s) Outcome: Completed/Met Goal: Individualized Newborn Educational Video(s) Outcome: Completed/Met   Problem: Life Cycle: Goal: Chance of risk for complications during the postpartum period will decrease Outcome: Completed/Met   Problem: Skin Integrity: Goal: Demonstration of wound healing without infection will improve Outcome: Completed/Met   Problem: Fluid Volume: Goal: Peripheral tissue perfusion will improve Outcome: Completed/Met   Problem: Clinical Measurements: Goal: Complications related to disease process, condition or treatment will be avoided or minimized Outcome: Completed/Met

## 2022-07-16 ENCOUNTER — Other Ambulatory Visit (HOSPITAL_COMMUNITY): Payer: Self-pay

## 2022-07-16 LAB — COMPREHENSIVE METABOLIC PANEL
ALT: 26 U/L (ref 0–44)
AST: 24 U/L (ref 15–41)
Albumin: 3 g/dL — ABNORMAL LOW (ref 3.5–5.0)
Alkaline Phosphatase: 108 U/L (ref 38–126)
Anion gap: 13 (ref 5–15)
BUN: 9 mg/dL (ref 6–20)
CO2: 23 mmol/L (ref 22–32)
Calcium: 9.2 mg/dL (ref 8.9–10.3)
Chloride: 100 mmol/L (ref 98–111)
Creatinine, Ser: 0.72 mg/dL (ref 0.44–1.00)
GFR, Estimated: >60 mL/min (ref >60)
Glucose, Bld: 98 mg/dL (ref 70–99)
Potassium: 4 mmol/L (ref 3.5–5.1)
Sodium: 136 mmol/L (ref 135–145)
Total Bilirubin: 0.6 mg/dL (ref 0.3–1.2)
Total Protein: 6.7 g/dL (ref 6.5–8.1)

## 2022-07-16 LAB — CBC WITH DIFFERENTIAL/PLATELET
Abs Immature Granulocytes: 0.21 10*3/uL — ABNORMAL HIGH (ref 0.00–0.07)
Basophils Absolute: 0 10*3/uL (ref 0.0–0.1)
Basophils Relative: 0 %
Eosinophils Absolute: 0.2 10*3/uL (ref 0.0–0.5)
Eosinophils Relative: 2 %
HCT: 36.9 % (ref 36.0–46.0)
Hemoglobin: 11.9 g/dL — ABNORMAL LOW (ref 12.0–15.0)
Immature Granulocytes: 2 %
Lymphocytes Relative: 23 %
Lymphs Abs: 2.7 10*3/uL (ref 0.7–4.0)
MCH: 26.1 pg (ref 26.0–34.0)
MCHC: 32.2 g/dL (ref 30.0–36.0)
MCV: 80.9 fL (ref 80.0–100.0)
Monocytes Absolute: 1.1 10*3/uL — ABNORMAL HIGH (ref 0.1–1.0)
Monocytes Relative: 9 %
Neutro Abs: 7.5 10*3/uL (ref 1.7–7.7)
Neutrophils Relative %: 64 %
Platelets: 392 10*3/uL (ref 150–400)
RBC: 4.56 MIL/uL (ref 3.87–5.11)
RDW: 19.2 % — ABNORMAL HIGH (ref 11.5–15.5)
WBC: 11.7 10*3/uL — ABNORMAL HIGH (ref 4.0–10.5)
nRBC: 0.3 % — ABNORMAL HIGH (ref 0.0–0.2)

## 2022-07-16 LAB — PATHOLOGIST SMEAR REVIEW

## 2022-07-16 MED ORDER — NIFEDIPINE ER 90 MG PO TB24
90.0000 mg | ORAL_TABLET | Freq: Every day | ORAL | 0 refills | Status: AC
  Filled 2022-07-16: qty 30, 30d supply, fill #0

## 2022-07-16 NOTE — Progress Notes (Signed)
Subjective: POD# 7 Denies HA, visual changes, and RUQ pain. Discussed antihypertensives prescription and follow-up care at [facility] this week for BP check. Reportable pre-eclampsia symptoms reviewed.     Objective:  VS:  Vitals:   07/16/22 0345 07/16/22 0924 07/16/22 1143 07/16/22 1213  BP: 128/79 (!) 126/93 125/83 128/80  Pulse: 79 77 94 (!) 110  Resp:  16  16  Temp:  98.7 F (37.1 C)  98.1 F (36.7 C)  TempSrc:  Oral  Oral  SpO2:  100%  100%  Weight:      Height:       No intake or output data in the 24 hours ending 07/16/22 0853   Recent Labs    07/14/22 0410 07/16/22 0524  WBC 11.7* 11.7*  HGB 9.3* 11.9*  HCT 28.8* 36.9  PLT 294 392    Physical Exam:  General: alert, cooperative, and no distress Resp: unlabored Abdomen: soft, nontender, normal bowel sounds Incision:  well-approximated Perineum:  Uterine Fundus: firm, below umbilicus, nontender Lochia:  scant Ext:  neg for edema, pain, tenderness, and cords   Assessment/Plan: 32 y.o.   POD# 7. [id number]                  Active Problems:   Encounter for induction of labor   S/P cesarean section   Acute blood loss anemia   Gestational hypertension   Hypertension in pregnancy, preeclampsia, severe, delivered/postpartum   Hypocalcemia   Hypokalemia   Continue Procardia XL 90 mg PO daily Discharge home today Follow-up in 3-4 days at [facility] for BP check  Arrie Eastern, DNP, CNM 07/16/2022, 8:53 AM

## 2022-07-21 ENCOUNTER — Telehealth (HOSPITAL_COMMUNITY): Payer: Self-pay

## 2022-07-21 NOTE — Telephone Encounter (Signed)
Patient did not answer phone call. Voicemail left for patient.   Carrie [name] [facility] 07/21/22,0910

## 2022-08-22 ENCOUNTER — Telehealth (HOSPITAL_COMMUNITY): Payer: Self-pay

## 2022-08-22 NOTE — Telephone Encounter (Signed)
Manual entry of labs
